# Patient Record
Sex: Female | Born: 1960 | Race: White | Hispanic: No | Smoking: Never smoker
Health system: Southern US, Community
[De-identification: ages and names within clinical notes are randomized; demographics above are authoritative.]

## PROBLEM LIST (undated history)

## (undated) DIAGNOSIS — N760 Acute vaginitis: Secondary | ICD-10-CM

## (undated) DIAGNOSIS — R519 Headache, unspecified: Secondary | ICD-10-CM

## (undated) DIAGNOSIS — J329 Chronic sinusitis, unspecified: Secondary | ICD-10-CM

## (undated) DIAGNOSIS — R51 Headache: Secondary | ICD-10-CM

## (undated) DIAGNOSIS — E785 Hyperlipidemia, unspecified: Secondary | ICD-10-CM

## (undated) DIAGNOSIS — J309 Allergic rhinitis, unspecified: Secondary | ICD-10-CM

## (undated) HISTORY — DX: Hyperlipidemia, unspecified: E78.5

## (undated) HISTORY — DX: Allergic rhinitis, unspecified: J30.9

## (undated) HISTORY — PX: COLONOSCOPY: SHX174

## (undated) HISTORY — PX: TOTAL ABDOMINAL HYSTERECTOMY W/ BILATERAL SALPINGOOPHORECTOMY: SHX83

## (undated) HISTORY — PX: NASAL SINUS SURGERY: SHX719

## (undated) HISTORY — DX: Acute vaginitis: N76.0

## (undated) HISTORY — DX: Chronic sinusitis, unspecified: J32.9

## (undated) HISTORY — PX: ABDOMINAL HYSTERECTOMY: SHX81

---

## 2005-09-08 ENCOUNTER — Other Ambulatory Visit: Payer: Self-pay

## 2005-09-16 ENCOUNTER — Ambulatory Visit: Payer: Self-pay | Admitting: Otolaryngology

## 2011-10-20 ENCOUNTER — Ambulatory Visit: Payer: Self-pay | Admitting: Family Medicine

## 2011-12-26 ENCOUNTER — Ambulatory Visit: Payer: Self-pay | Admitting: Unknown Physician Specialty

## 2012-11-04 ENCOUNTER — Ambulatory Visit: Payer: Self-pay | Admitting: Family Medicine

## 2012-11-28 ENCOUNTER — Ambulatory Visit: Payer: Self-pay | Admitting: Physician Assistant

## 2013-11-08 ENCOUNTER — Ambulatory Visit: Payer: Self-pay | Admitting: Family Medicine

## 2014-11-16 ENCOUNTER — Ambulatory Visit: Payer: Self-pay | Admitting: Obstetrics and Gynecology

## 2015-03-22 ENCOUNTER — Ambulatory Visit
Admit: 2015-03-22 | Disposition: A | Discharge status: Home / Self Care | Payer: Self-pay | Attending: Family Medicine | Admitting: Family Medicine

## 2015-03-30 DIAGNOSIS — N939 Abnormal uterine and vaginal bleeding, unspecified: Secondary | ICD-10-CM

## 2015-04-10 ENCOUNTER — Encounter
Admission: RE | Admit: 2015-04-10 | Discharge: 2015-04-10 | Disposition: A | Discharge status: Home / Self Care | Payer: BLUE CROSS/BLUE SHIELD | Source: Ambulatory Visit | Attending: Obstetrics and Gynecology | Admitting: Obstetrics and Gynecology

## 2015-04-10 DIAGNOSIS — E785 Hyperlipidemia, unspecified: Secondary | ICD-10-CM | POA: Diagnosis not present

## 2015-04-10 DIAGNOSIS — Z01810 Encounter for preprocedural cardiovascular examination: Secondary | ICD-10-CM | POA: Insufficient documentation

## 2015-04-10 DIAGNOSIS — I10 Essential (primary) hypertension: Secondary | ICD-10-CM | POA: Diagnosis not present

## 2015-04-10 DIAGNOSIS — J329 Chronic sinusitis, unspecified: Secondary | ICD-10-CM | POA: Insufficient documentation

## 2015-04-10 DIAGNOSIS — N760 Acute vaginitis: Secondary | ICD-10-CM | POA: Insufficient documentation

## 2015-04-10 DIAGNOSIS — Z01812 Encounter for preprocedural laboratory examination: Secondary | ICD-10-CM | POA: Diagnosis present

## 2015-04-10 LAB — CBC
HCT: 37.2 % (ref 35.0–47.0)
HEMOGLOBIN: 12.6 g/dL (ref 12.0–16.0)
MCH: 32.3 pg (ref 26.0–34.0)
MCHC: 34 g/dL (ref 32.0–36.0)
MCV: 95 fL (ref 80.0–100.0)
Platelets: 278 10*3/uL (ref 150–440)
RBC: 3.91 MIL/uL (ref 3.80–5.20)
RDW: 13.2 % (ref 11.5–14.5)
WBC: 6.5 10*3/uL (ref 3.6–11.0)

## 2015-04-10 LAB — BASIC METABOLIC PANEL
Anion gap: 7 (ref 5–15)
BUN: 13 mg/dL (ref 6–20)
CO2: 28 mmol/L (ref 22–32)
Calcium: 8.9 mg/dL (ref 8.9–10.3)
Chloride: 103 mmol/L (ref 101–111)
Creatinine, Ser: 0.67 mg/dL (ref 0.44–1.00)
GFR calc Af Amer: >60 mL/min (ref >60)
GFR calc non Af Amer: >60 mL/min (ref >60)
GLUCOSE: 85 mg/dL (ref 65–99)
POTASSIUM: 3.9 mmol/L (ref 3.5–5.1)
Sodium: 138 mmol/L (ref 135–145)

## 2015-04-10 NOTE — Patient Instructions (Signed)
  Your procedure is scheduled on: Friday May 20,2016 Report to Day Surgery. To find out your arrival time please call [phone] between 1PM - 3PM on Thursday May 19,2016.  Remember: Instructions that are not followed completely may result in serious medical risk, up to and including death, or upon the discretion of your surgeon and anesthesiologist your surgery may need to be rescheduled.    __x__ 1. Do not eat food or drink liquids after midnight. No gum chewing or hard candies.     __x__ 2. No Alcohol for 24 hours before or after surgery.   __x__ 3. Bring all medications with you on the day of surgery if instructed.    __x__ 4. Notify your doctor if there is any change in your medical condition     (cold, fever, infections).     Do not wear jewelry, make-up, hairpins, clips or nail polish.  Do not wear lotions, powders, or perfumes. You may wear deodorant.  Do not shave 48 hours prior to surgery. Men may shave face and neck.  Do not bring valuables to the hospital.    [facility] is not responsible for any belongings or valuables.               Contacts, dentures or bridgework may not be worn into surgery.  Leave your suitcase in the car. After surgery it may be brought to your room.  For patients admitted to the hospital, discharge time is determined by your                treatment team.   Patients discharged the day of surgery will not be allowed to drive home.   Please read over the following fact sheets that you were given:   Surgical Site Infection Prevention   ____ Take these medicines the morning of surgery with A SIP OF WATER:    1.   2.   3.   4.  5.  6.  ____ Fleet Enema (as directed)   ____ Use CHG Soap as directed  ____ Use inhalers on the day of surgery  ____ Stop metformin 2 days prior to surgery    ____ Take 1/2 of usual insulin dose the night before surgery and none on the morning of surgery.   ____ Stop Coumadin/Plavix/aspirin on   ____ Stop  Anti-inflammatories on    ____ Stop supplements until after surgery.    ____ Bring C-Pap to the hospital.

## 2015-04-11 LAB — TYPE AND SCREEN
ABO/RH(D): O POS
Antibody Screen: NEGATIVE

## 2015-04-11 LAB — ABO/RH: ABO/RH(D): O POS

## 2015-04-20 ENCOUNTER — Ambulatory Visit
Admission: RE | Admit: 2015-04-20 | Discharge: 2015-04-20 | Disposition: A | Discharge status: Home / Self Care | Payer: BLUE CROSS/BLUE SHIELD | Source: Ambulatory Visit | Attending: Obstetrics and Gynecology | Admitting: Obstetrics and Gynecology

## 2015-04-20 ENCOUNTER — Encounter
Admission: RE | Disposition: A | Discharge status: Home / Self Care | Payer: Self-pay | Source: Ambulatory Visit | Attending: Obstetrics and Gynecology

## 2015-04-20 ENCOUNTER — Encounter: Payer: Self-pay | Admitting: *Deleted

## 2015-04-20 ENCOUNTER — Ambulatory Visit: Payer: BLUE CROSS/BLUE SHIELD | Admitting: Anesthesiology

## 2015-04-20 DIAGNOSIS — Z79899 Other long term (current) drug therapy: Secondary | ICD-10-CM | POA: Diagnosis not present

## 2015-04-20 DIAGNOSIS — R938 Abnormal findings on diagnostic imaging of other specified body structures: Secondary | ICD-10-CM | POA: Insufficient documentation

## 2015-04-20 DIAGNOSIS — N938 Other specified abnormal uterine and vaginal bleeding: Secondary | ICD-10-CM | POA: Insufficient documentation

## 2015-04-20 DIAGNOSIS — G43909 Migraine, unspecified, not intractable, without status migrainosus: Secondary | ICD-10-CM | POA: Insufficient documentation

## 2015-04-20 DIAGNOSIS — I10 Essential (primary) hypertension: Secondary | ICD-10-CM | POA: Insufficient documentation

## 2015-04-20 DIAGNOSIS — Z7982 Long term (current) use of aspirin: Secondary | ICD-10-CM | POA: Diagnosis not present

## 2015-04-20 DIAGNOSIS — N939 Abnormal uterine and vaginal bleeding, unspecified: Secondary | ICD-10-CM

## 2015-04-20 HISTORY — PX: HYSTEROSCOPY W/D&C: SHX1775

## 2015-04-20 LAB — POCT PREGNANCY, URINE: PREG TEST UR: NEGATIVE

## 2015-04-20 SURGERY — DILATATION AND CURETTAGE /HYSTEROSCOPY
Anesthesia: Choice

## 2015-04-20 MED ORDER — DOCUSATE SODIUM 100 MG PO CAPS: 100.0000 mg | ORAL_CAPSULE | Freq: Two times a day (BID) | ORAL | Status: DC | PRN

## 2015-04-20 MED ORDER — FAMOTIDINE 20 MG PO TABS
ORAL_TABLET | ORAL | Status: AC
  Administered 2015-04-20: 20 mg via ORAL
  Filled 2015-04-20: qty 1

## 2015-04-20 MED ORDER — ACETAMINOPHEN 10 MG/ML IV SOLN
INTRAVENOUS | Status: DC | PRN
  Administered 2015-04-20: 1000 mg via INTRAVENOUS

## 2015-04-20 MED ORDER — FENTANYL CITRATE (PF) 100 MCG/2ML IJ SOLN
25.0000 ug | INTRAMUSCULAR | Status: DC | PRN
  Administered 2015-04-20: 25 ug via INTRAVENOUS

## 2015-04-20 MED ORDER — LIDOCAINE HCL (PF) 1 % IJ SOLN
INTRAMUSCULAR | Status: AC
  Filled 2015-04-20: qty 2

## 2015-04-20 MED ORDER — FAMOTIDINE 20 MG PO TABS
20.0000 mg | ORAL_TABLET | Freq: Once | ORAL | Status: AC
  Administered 2015-04-20: 20 mg via ORAL

## 2015-04-20 MED ORDER — FENTANYL CITRATE (PF) 100 MCG/2ML IJ SOLN
INTRAMUSCULAR | Status: AC
  Filled 2015-04-20: qty 2

## 2015-04-20 MED ORDER — LACTATED RINGERS IV SOLN
INTRAVENOUS | Status: DC
  Administered 2015-04-20 (×2): via INTRAVENOUS

## 2015-04-20 MED ORDER — ONDANSETRON HCL 4 MG/2ML IJ SOLN: 4.0000 mg | Freq: Once | INTRAMUSCULAR | Status: DC | PRN

## 2015-04-20 MED ORDER — PROPOFOL INFUSION 10 MG/ML OPTIME
INTRAVENOUS | Status: DC | PRN
  Administered 2015-04-20: 180 ug/kg/min via INTRAVENOUS

## 2015-04-20 MED ORDER — LIDOCAINE HCL (CARDIAC) 20 MG/ML IV SOLN
INTRAVENOUS | Status: DC | PRN
  Administered 2015-04-20: 80 mg via INTRAVENOUS

## 2015-04-20 MED ORDER — SILVER NITRATE-POT NITRATE 75-25 % EX MISC
CUTANEOUS | Status: DC | PRN
  Administered 2015-04-20: 1

## 2015-04-20 MED ORDER — MIDAZOLAM HCL 2 MG/2ML IJ SOLN
INTRAMUSCULAR | Status: DC | PRN
  Administered 2015-04-20: 2 mg via INTRAVENOUS

## 2015-04-20 MED ORDER — SILVER NITRATE-POT NITRATE 75-25 % EX MISC
CUTANEOUS | Status: AC
  Filled 2015-04-20: qty 1

## 2015-04-20 MED ORDER — ACETAMINOPHEN 10 MG/ML IV SOLN
INTRAVENOUS | Status: AC
  Filled 2015-04-20: qty 100

## 2015-04-20 MED ORDER — FENTANYL CITRATE (PF) 100 MCG/2ML IJ SOLN
INTRAMUSCULAR | Status: DC | PRN
  Administered 2015-04-20: 50 ug via INTRAVENOUS

## 2015-04-20 MED ORDER — KETOROLAC TROMETHAMINE 30 MG/ML IJ SOLN
INTRAMUSCULAR | Status: DC | PRN
  Administered 2015-04-20: 30 mg via INTRAVENOUS

## 2015-04-20 MED ORDER — IBUPROFEN 600 MG PO TABS: 600.0000 mg | ORAL_TABLET | Freq: Four times a day (QID) | ORAL | Status: DC | PRN

## 2015-04-20 MED ORDER — BUPIVACAINE HCL (PF) 0.5 % IJ SOLN
INTRAMUSCULAR | Status: AC
  Filled 2015-04-20: qty 30

## 2015-04-20 SURGICAL SUPPLY — 15 items
CANISTER SUCT 3000ML (MISCELLANEOUS) ×3 IMPLANT
CATH ROBINSON RED A/P 16FR (CATHETERS) ×3 IMPLANT
GLOVE BIO SURGEON STRL SZ 6.5 (GLOVE) ×2 IMPLANT
GLOVE BIO SURGEONS STRL SZ 6.5 (GLOVE) ×1
GLOVE INDICATOR 7.0 STRL GRN (GLOVE) ×3 IMPLANT
GOWN STRL REUS W/ TWL LRG LVL3 (GOWN DISPOSABLE) ×2 IMPLANT
GOWN STRL REUS W/TWL LRG LVL3 (GOWN DISPOSABLE) ×4
IV LACTATED RINGERS 1000ML (IV SOLUTION) ×3 IMPLANT
KIT RM TURNOVER CYSTO AR (KITS) ×3 IMPLANT
PACK DNC HYST (MISCELLANEOUS) ×3 IMPLANT
PAD GROUND ADULT SPLIT (MISCELLANEOUS) ×3 IMPLANT
PAD OB MATERNITY 4.3X12.25 (PERSONAL CARE ITEMS) ×3 IMPLANT
PAD PREP 24X41 OB/GYN DISP (PERSONAL CARE ITEMS) ×3 IMPLANT
TUBING CONNECTING 10 (TUBING) ×2 IMPLANT
TUBING CONNECTING 10' (TUBING) ×1

## 2015-04-20 NOTE — Op Note (Signed)
Operative Report Hysteroscopy with Dilation and Curettage   Indications: Thickened endometrial stripe, abnormal uterine bleeding   Pre-operative Diagnosis: AUB  Post-operative Diagnosis: same.  Procedure: 1. D&C 2. Hysteroscopy  Surgeon: Christeen DouglasBethany Jerred Zaremba, MD  Assistant(s):  None  Anesthesia: General mask inhalational anesthesia  Estimated Blood Loss:  Minimal         Intraoperative medications: None         Total IV Fluids: 400ml  Urine Output: 25ml         Specimens: Endometrial curettings, endocervical curettings         Complications:  None; patient tolerated the procedure well.         Disposition: PACU - hemodynamically stable.         Condition: stable  Findings: Small, retroverted uterus measuring 8cm by sound; normal cervix, vagina, perineum, stenotic os. Thin atropic uterine lining consistent with findings on OCPs, and a gritty texture was noted on curettage from the outset of the procedure. Initially, there appeared to be a uterine adhesion or uterine septum, but after currettage this band of tissue was not apparent. Pictures were taken.  Indication for procedure/Consents: 54 y.o. G0 here for scheduled surgery for the aforementioned diagnoses.  Endometrial biopsy unsuccessful in the office. Risks of surgery were discussed with the patient including but not limited to: bleeding which may require transfusion; infection which may require antibiotics; injury to uterus or surrounding organs; intrauterine scarring which may impair future fertility; need for additional procedures including laparotomy or laparoscopy; and other postoperative/anesthesia complications. Written informed consent was obtained.    Procedure Details:   The patient was taken to the operating room where general iv anesthesia not requiring LMA was administered and was found to be adequate.  After a formal and adequate timeout was performed, she was placed in the dorsal lithotomy position and examined  with the above findings. She was then prepped and draped in the sterile manner.   Her bladder was catheterized for an estimated amount of clear, yellow urine. A weighed speculum was then placed in the patient's vagina and a single tooth tenaculum was applied to the anterior lip of the cervix. A second bite was taken on the posterior lip of the cervix to assist in visualization when the anterior bite was unsuccessful.  Her cervix required lacrimal dilators to accommodate full sized Hanks dilators. It was serially dilated to 16 [place].  An ECC was performed. The hysteroscope was introduced to reveal above findings. A sharp curettage was then performed until there was a gritty texture in all four quadrants.  The tenaculum was removed from the anterior lip of the cervix and the vaginal speculum was removed after applying silver nitrate for good hemostasis. The patient tolerated the procedure well and was taken to the recovery area awake and in stable condition. She received iv acetaminophen and Toradol prior to leaving the OR.  The patient will be discharged to home as per PACU criteria. Routine postoperative instructions given.  She was prescribed Ibuprofen and Colace.  She will follow up in the clinic in two weeks for postoperative evaluation.

## 2015-04-20 NOTE — Anesthesia Postprocedure Evaluation (Signed)
  Anesthesia Post-op Note  Patient: Kimberly Yang  Procedure(s) Performed: Procedure(s): DILATATION AND CURETTAGE /HYSTEROSCOPY (N/A)  Anesthesia type:No value filed.  Patient location: PACU  Post pain: Pain level controlled  Post assessment: Post-op Vital signs reviewed, Patient's Cardiovascular Status Stable, Respiratory Function Stable, Patent Airway and No signs of Nausea or vomiting  Post vital signs: Reviewed and stable  Last Vitals:  Filed Vitals:   04/20/15 1243  BP: 121/69  Pulse: 58  Temp:   Resp: 16    Level of consciousness: awake, alert  and patient cooperative  Complications: No apparent anesthesia complications

## 2015-04-20 NOTE — Progress Notes (Signed)
Preop report given to Vergia AlbertsSarah Olejar by Nicholes RoughSusan Lavra Imler

## 2015-04-20 NOTE — Transfer of Care (Signed)
Immediate Anesthesia Transfer of Care Note  Patient: Kimberly Yang  Procedure(s) Performed: Procedure(s): DILATATION AND CURETTAGE /HYSTEROSCOPY (N/A)  Patient Location: PACU  Anesthesia Type:General  Level of Consciousness: awake, alert  and oriented  Airway & Oxygen Therapy: Patient Spontanous Breathing and Patient connected to face mask oxygen  Post-op Assessment: Report given to RN, Post -op Vital signs reviewed and stable and Patient moving all extremities X 4  Post vital signs: Reviewed and stable  Last Vitals:  Filed Vitals:   04/20/15 0919  BP: 124/75  Pulse: 59  Temp: 36.9 C  Resp: 18    Complications: No apparent anesthesia complications

## 2015-04-20 NOTE — Anesthesia Preprocedure Evaluation (Signed)
Anesthesia Evaluation  Patient identified by MRN, date of birth, ID band Patient awake    Reviewed: Allergy & Precautions, H&P , NPO status , Patient's Chart, lab work & pertinent test results, reviewed documented beta blocker date and time   Airway Mallampati: II  TM Distance: >3 FB Neck ROM: full    Dental   Pulmonary Current Smoker,          Cardiovascular hypertension, Rate:Normal     Neuro/Psych    GI/Hepatic   Endo/Other    Renal/GU      Musculoskeletal   Abdominal   Peds  Hematology   Anesthesia Other Findings   Reproductive/Obstetrics                             Anesthesia Physical Anesthesia Plan  ASA: II  Anesthesia Plan:    Post-op Pain Management:    Induction:   Airway Management Planned:   Additional Equipment:   Intra-op Plan:   Post-operative Plan:   Informed Consent: I have reviewed the patients History and Physical, chart, labs and discussed the procedure including the risks, benefits and alternatives for the proposed anesthesia with the patient or authorized representative who has indicated his/her understanding and acceptance.     Plan Discussed with:   Anesthesia Plan Comments:         Anesthesia Quick Evaluation

## 2015-04-20 NOTE — H&P (Signed)
  Date of Initial H&P: 04/10/15  History reviewed, patient examined, no change in status, stable for surgery.  Paper chart H&P completed and will be scanned into EMR.  

## 2015-04-20 NOTE — Discharge Instructions (Signed)
Discharge instructions after a hysteroscopy with dilation and curettage ? ?Signs and Symptoms to Report ? ?Call our office at [phone] if you have any of the following:  ? ? Fever over 100.4 degrees or higher ? Severe stomach pain not relieved with pain medications ? Bright red bleeding that?s heavier than a period that does not slow with rest after the first 24 hours ? To go the bathroom a lot (frequency), you can?t hold your urine (urgency), or it hurts when you empty your bladder (urinate) ? Chest pain ? Shortness of breath ? Pain in the calves of your legs ? Severe nausea and vomiting not relieved with anti-nausea medications ? Any concerns ? ?What You Can Expect after Surgery ? You may see some pink tinged, bloody fluid. This is normal. You may also have cramping for several days.  ? ?Activities after Your Discharge ?Follow these guidelines to help speed your recovery at home: ? Don?t drive if you are in pain or taking narcotic pain medicine. You may drive when you can safely slam on the brakes, turn the wheel forcefully, and rotate your torso comfortably. This is typically 4-7 days. Practice in a parking lot or side street prior to attempting to drive regularly.  ? Ask others to help with household chores for 4 weeks. ? Don?t do strenuous activities, exercises, or sports like vacuuming, tennis, squash, etc. until your doctor says it is safe to do so. ? Walk as you feel able. Rest often since it may take a week or two for your energy level to return to normal.  ? You may climb stairs ? Avoid constipation: ?  -Eat fruits, vegetables, and whole grains. Eat small meals as your appetite will take time to return to normal. ?  -Drink 6 to 8 glasses of water each day unless your doctor has told you to limit your fluids. ?  -Use a laxative or stool softener as needed if constipation becomes a problem. You may take Miralax, metamucil, Citrucil, Colace, Senekot, FiberCon, etc. If this does not relieve the  constipation, try two tablespoons of Milk Of Magnesia every 8 hours until your bowels move.  ? You may shower.  ? Do not get in a hot tub, swimming pool, etc. until your doctor agrees. ? Do not douche, use tampons, or have sex until your doctor says it is okay, usually about 2 weeks. ? Take your pain medicine when you need it. The medicine may not work as well if the pain is bad. ? ?Take the medicines you were taking before surgery. Other medications you might need are pain medications (ibuprofen), medications for constipation (Colace) and nausea medications (Zofran).  ? ?  ?

## 2015-04-23 ENCOUNTER — Encounter: Payer: Self-pay | Admitting: Obstetrics and Gynecology

## 2015-04-23 LAB — SURGICAL PATHOLOGY

## 2015-05-15 ENCOUNTER — Other Ambulatory Visit: Payer: BLUE CROSS/BLUE SHIELD

## 2015-05-15 ENCOUNTER — Encounter: Payer: Self-pay | Admitting: *Deleted

## 2015-05-15 ENCOUNTER — Other Ambulatory Visit: Payer: Self-pay | Admitting: Obstetrics and Gynecology

## 2015-05-15 ENCOUNTER — Ambulatory Visit: Payer: Self-pay | Admitting: Family Medicine

## 2015-05-15 DIAGNOSIS — Z79899 Other long term (current) drug therapy: Secondary | ICD-10-CM | POA: Diagnosis not present

## 2015-05-15 DIAGNOSIS — Z793 Long term (current) use of hormonal contraceptives: Secondary | ICD-10-CM | POA: Diagnosis present

## 2015-05-15 DIAGNOSIS — N800 Endometriosis of uterus: Secondary | ICD-10-CM | POA: Diagnosis not present

## 2015-05-15 DIAGNOSIS — Z9889 Other specified postprocedural states: Secondary | ICD-10-CM | POA: Diagnosis not present

## 2015-05-15 DIAGNOSIS — Z8249 Family history of ischemic heart disease and other diseases of the circulatory system: Secondary | ICD-10-CM | POA: Diagnosis not present

## 2015-05-15 DIAGNOSIS — N838 Other noninflammatory disorders of ovary, fallopian tube and broad ligament: Secondary | ICD-10-CM | POA: Diagnosis not present

## 2015-05-15 DIAGNOSIS — I10 Essential (primary) hypertension: Secondary | ICD-10-CM | POA: Diagnosis not present

## 2015-05-15 DIAGNOSIS — Z823 Family history of stroke: Secondary | ICD-10-CM | POA: Diagnosis not present

## 2015-05-15 DIAGNOSIS — E785 Hyperlipidemia, unspecified: Secondary | ICD-10-CM | POA: Diagnosis not present

## 2015-05-15 DIAGNOSIS — Z7982 Long term (current) use of aspirin: Secondary | ICD-10-CM | POA: Diagnosis present

## 2015-05-15 DIAGNOSIS — N939 Abnormal uterine and vaginal bleeding, unspecified: Secondary | ICD-10-CM | POA: Diagnosis present

## 2015-05-15 NOTE — H&P (Signed)
Chief Complaint: persistent AUB.  History of Present Illness: Kimberly Yang is a 54 y.o. G0P0000 who desires definitive management of her persistent AUB. She has failed medical management.   She underwent an uncomplicated D&C, hysteroscopy and ECC 04/20/15 for AUB - she continues to have her period, despite this surgery and OCPs. She also continues ot have hot flashes, night sweats and insomnia.  TVUS 03/30/15 Thickened endometrium=7.98 mm Lt ov wnl Rt ov with two simple cysts= 1 =0.80 cm 2= 0.82 cm  Fractional D&C pathology was benign  She has been maintained on OCPs for this indication, without successful resolution of her bleeding.  Past Medical History:  has a past medical history of History of chickenpox; Hyperlipidemia; Hypertension; and Migraines. Problem List: has Hyperlipidemia; Hypertension; Migraines; and Abnormal uterine bleeding (AUB) on her problem list. Past Surgical History:  has past surgical history that includes Nasal surgery. Family History: family history includes Heart attack in her mother; Hypertension in her mother; Stroke in her brother. Social History:  reports that she has never smoked. She has never used smokeless tobacco. She reports that she does not drink alcohol or use illicit drugs.  Prior to encounter Medications:  Current Outpatient Prescriptions on File Prior to Visit  Medication Sig Dispense Refill  . aspirin 81 MG EC tablet Take 81 mg by mouth once daily.    Marland Kitchen atorvastatin (LIPITOR) 20 MG tablet Take 20 mg by mouth once daily.    . bisoprolol-hydrochlorothiazide (ZIAC) 5-6.25 mg tablet Take 1 tablet by mouth once daily.    . cholecalciferol (CHOLECALCIFEROL) 1,000 unit tablet Take 1,000 Units by mouth once daily.    Marland Kitchen EVENING PRIMROSE OIL ORAL Take by mouth.    . metroNIDAZOLE (METROGEL) 0.75 % vaginal gel Place 1 applicator vaginally nightly. 70 g 0  . norethindrone-ethinyl estradiol (MICROGESTIN,LOESTRIN,JUNEL  1/20) 1-20 mg-mcg tablet Take 1 tablet by mouth once daily. 3 Package 3   No current facility-administered medications on file prior to visit.     Medications:  Current Outpatient Prescriptions on File Prior to Visit  Medication Sig Dispense Refill  . aspirin 81 MG EC tablet Take 81 mg by mouth once daily.    Marland Kitchen atorvastatin (LIPITOR) 20 MG tablet Take 20 mg by mouth once daily.    . bisoprolol-hydrochlorothiazide (ZIAC) 5-6.25 mg tablet Take 1 tablet by mouth once daily.    . cholecalciferol (CHOLECALCIFEROL) 1,000 unit tablet Take 1,000 Units by mouth once daily.    Marland Kitchen EVENING PRIMROSE OIL ORAL Take by mouth.    . metroNIDAZOLE (METROGEL) 0.75 % vaginal gel Place 1 applicator vaginally nightly. 70 g 0  . norethindrone-ethinyl estradiol (MICROGESTIN,LOESTRIN,JUNEL 1/20) 1-20 mg-mcg tablet Take 1 tablet by mouth once daily. 3 Package 3   No current facility-administered medications on file prior to visit.   Allergies: Review of patient's allergies indicates no known allergies.  Physical Exam: BP 122/87 mmHg  Pulse 68  Wt 77.384 kg (170 lb 9.6 oz)  LMP 04/30/2015  General appearance: Well nourished, well developed female in no acute distress.   Abdomen: Nontender to palpation, nondistended, no masses or hernias.  Incisions: none.  Neuro/Psych: Normal mood and affect.   Pelvic exam: normal   Assessment:   Full preop visit performed today for a total of 20 min for a LAVH, BSO for AUB failed medical management. Risks, benefits and alternative discussed. Consents signed today.  Patient returns for a preoperative discussion regarding her plans to proceed with surgical treatment of her AUB by  above procedures. The patient and I discussed the technical aspects of the procedure including the potential for risks and complications. These include but are not limited to the risk of infection requiring post-operative antibiotics or  further procedures. We talked about the risk of injury to adjacent organs including bladder, bowel, ureter, blood vessels or nerves. We talked about the need to convert to an open incision. We talked about the possible need for blood transfusion. We talked aboutpostop complications such asthromboembolic or cardiopulmonary complications. All of her questions were answered. Her preoperative exam was completed and the appropriate consents were signed. She is scheduled to undergo this procedure 05/21/15.        Her FSH is pre menopausal. Check a TSH at next visit. Plan for HRT for vasomotor sx.

## 2015-05-15 NOTE — Patient Instructions (Signed)
  Your procedure is scheduled on: 05/21/15 Report to Day Surgery. To find out your arrival time please call [phone] between 1PM - 3PM on 05/18/15.  Remember: Instructions that are not followed completely may result in serious medical risk, up to and including death, or upon the discretion of your surgeon and anesthesiologist your surgery may need to be rescheduled.    ___x_ 1. Do not eat food or drink liquids after midnight. No gum chewing or hard candies.     _x___ 2. No Alcohol for 24 hours before or after surgery.   ____ 3. Bring all medications with you on the day of surgery if instructed.    __x__ 4. Notify your doctor if there is any change in your medical condition     (cold, fever, infections).     Do not wear jewelry, make-up, hairpins, clips or nail polish.  Do not wear lotions, powders, or perfumes. You may wear deodorant.  Do not shave 48 hours prior to surgery. Men may shave face and neck.  Do not bring valuables to the hospital.    [facility] is not responsible for any belongings or valuables.               Contacts, dentures or bridgework may not be worn into surgery.  Leave your suitcase in the car. After surgery it may be brought to your room.  For patients admitted to the hospital, discharge time is determined by your                treatment team.   Patients discharged the day of surgery will not be allowed to drive home.   Please read over the following fact sheets that you were given:   Surgical Site Infection Prevention   ____ Take these medicines the morning of surgery with A SIP OF WATER:    1. ziac  2.   3.   4.  5.  6.  ____ Fleet Enema (as directed)   x____ Use CHG Soap as directed  ____ Use inhalers on the day of surgery  ____ Stop metformin 2 days prior to surgery    ____ Take 1/2 of usual insulin dose the night before surgery and none on the morning of surgery.   ____ Stop Coumadin/Plavix/aspirin on  ____ Stop Anti-inflammatories  on   ____ Stop supplements until after surgery.    ____ Bring C-Pap to the hospital.

## 2015-05-15 NOTE — H&P (Signed)
Chief Complaint: persistent AUB.  History of Present Illness: Kimberly Yang is a 54 y.o. G0P0000 who desires definitive management of her persistent AUB. She has failed medical management.   She underwent an uncomplicated D&C, hysteroscopy and ECC 04/20/15 for AUB - she continues to have her period, despite this surgery and OCPs. She also continues ot have hot flashes, night sweats and insomnia.  TVUS 03/30/15 Thickened endometrium=7.98 mm Lt ov wnl Rt ov with two simple cysts= 1 =0.80 cm 2= 0.82 cm  Fractional D&C pathology was benign  She has been maintained on OCPs for this indication, without successful resolution of her bleeding.  Past Medical History:  has a past medical history of History of chickenpox; Hyperlipidemia; Hypertension; and Migraines. Problem List: has Hyperlipidemia; Hypertension; Migraines; and Abnormal uterine bleeding (AUB) on her problem list. Past Surgical History:  has past surgical history that includes Nasal surgery. Family History: family history includes Heart attack in her mother; Hypertension in her mother; Stroke in her brother. Social History:  reports that she has never smoked. She has never used smokeless tobacco. She reports that she does not drink alcohol or use illicit drugs.  Prior to encounter Medications:  Current Outpatient Prescriptions on File Prior to Visit  Medication Sig Dispense Refill  . aspirin 81 MG EC tablet Take 81 mg by mouth once daily.    . atorvastatin (LIPITOR) 20 MG tablet Take 20 mg by mouth once daily.    . bisoprolol-hydrochlorothiazide (ZIAC) 5-6.25 mg tablet Take 1 tablet by mouth once daily.    . cholecalciferol (CHOLECALCIFEROL) 1,000 unit tablet Take 1,000 Units by mouth once daily.    . EVENING PRIMROSE OIL ORAL Take by mouth.    . metroNIDAZOLE (METROGEL) 0.75 % vaginal gel Place 1 applicator vaginally nightly. 70 g 0  . norethindrone-ethinyl estradiol (MICROGESTIN,LOESTRIN,JUNEL  1/20) 1-20 mg-mcg tablet Take 1 tablet by mouth once daily. 3 Package 3   No current facility-administered medications on file prior to visit.     Medications:  Current Outpatient Prescriptions on File Prior to Visit  Medication Sig Dispense Refill  . aspirin 81 MG EC tablet Take 81 mg by mouth once daily.    . atorvastatin (LIPITOR) 20 MG tablet Take 20 mg by mouth once daily.    . bisoprolol-hydrochlorothiazide (ZIAC) 5-6.25 mg tablet Take 1 tablet by mouth once daily.    . cholecalciferol (CHOLECALCIFEROL) 1,000 unit tablet Take 1,000 Units by mouth once daily.    . EVENING PRIMROSE OIL ORAL Take by mouth.    . metroNIDAZOLE (METROGEL) 0.75 % vaginal gel Place 1 applicator vaginally nightly. 70 g 0  . norethindrone-ethinyl estradiol (MICROGESTIN,LOESTRIN,JUNEL 1/20) 1-20 mg-mcg tablet Take 1 tablet by mouth once daily. 3 Package 3   No current facility-administered medications on file prior to visit.   Allergies: Review of patient's allergies indicates no known allergies.  Physical Exam: BP 122/87 mmHg  Pulse 68  Wt 77.384 kg (170 lb 9.6 oz)  LMP 04/30/2015  General appearance: Well nourished, well developed female in no acute distress.   Abdomen: Nontender to palpation, nondistended, no masses or hernias.  Incisions: none.  Neuro/Psych: Normal mood and affect.   Pelvic exam: normal   Assessment:   Full preop visit performed today for a total of 20 min for a LAVH, BSO for AUB failed medical management. Risks, benefits and alternative discussed. Consents signed today.  Patient returns for a preoperative discussion regarding her plans to proceed with surgical treatment of her AUB by   above procedures. The patient and I discussed the technical aspects of the procedure including the potential for risks and complications. These include but are not limited to the risk of infection requiring post-operative antibiotics or  further procedures. We talked about the risk of injury to adjacent organs including bladder, bowel, ureter, blood vessels or nerves. We talked about the need to convert to an open incision. We talked about the possible need for blood transfusion. We talked aboutpostop complications such asthromboembolic or cardiopulmonary complications. All of her questions were answered. Her preoperative exam was completed and the appropriate consents were signed. She is scheduled to undergo this procedure 05/21/15.        Her FSH is pre menopausal. Check a TSH at next visit. Plan for HRT for vasomotor sx. 

## 2015-05-16 ENCOUNTER — Encounter
Admission: RE | Admit: 2015-05-16 | Discharge: 2015-05-16 | Disposition: A | Discharge status: Home / Self Care | Payer: BLUE CROSS/BLUE SHIELD | Source: Ambulatory Visit | Attending: Anesthesiology | Admitting: Anesthesiology

## 2015-05-16 DIAGNOSIS — N939 Abnormal uterine and vaginal bleeding, unspecified: Secondary | ICD-10-CM | POA: Diagnosis not present

## 2015-05-16 DIAGNOSIS — Z01812 Encounter for preprocedural laboratory examination: Secondary | ICD-10-CM | POA: Diagnosis present

## 2015-05-16 DIAGNOSIS — Z01810 Encounter for preprocedural cardiovascular examination: Secondary | ICD-10-CM | POA: Diagnosis present

## 2015-05-16 LAB — BASIC METABOLIC PANEL
ANION GAP: 9 (ref 5–15)
BUN: 12 mg/dL (ref 6–20)
CALCIUM: 8.7 mg/dL — AB (ref 8.9–10.3)
CO2: 25 mmol/L (ref 22–32)
Chloride: 102 mmol/L (ref 101–111)
Creatinine, Ser: 0.71 mg/dL (ref 0.44–1.00)
GLUCOSE: 86 mg/dL (ref 65–99)
Potassium: 3.8 mmol/L (ref 3.5–5.1)
Sodium: 136 mmol/L (ref 135–145)

## 2015-05-16 LAB — CBC
HCT: 39.4 % (ref 35.0–47.0)
HEMOGLOBIN: 13 g/dL (ref 12.0–16.0)
MCH: 31.6 pg (ref 26.0–34.0)
MCHC: 33 g/dL (ref 32.0–36.0)
MCV: 95.7 fL (ref 80.0–100.0)
Platelets: 269 10*3/uL (ref 150–440)
RBC: 4.11 MIL/uL (ref 3.80–5.20)
RDW: 13.4 % (ref 11.5–14.5)
WBC: 6.6 10*3/uL (ref 3.6–11.0)

## 2015-05-16 LAB — TYPE AND SCREEN
ABO/RH(D): O POS
Antibody Screen: NEGATIVE

## 2015-05-21 ENCOUNTER — Encounter
Admission: RE | Disposition: A | Discharge status: Home / Self Care | Payer: Self-pay | Source: Ambulatory Visit | Attending: Obstetrics and Gynecology

## 2015-05-21 ENCOUNTER — Ambulatory Visit: Payer: BLUE CROSS/BLUE SHIELD | Admitting: Anesthesiology

## 2015-05-21 ENCOUNTER — Observation Stay
Admission: RE | Admit: 2015-05-21 | Discharge: 2015-05-22 | Disposition: A | Discharge status: Home / Self Care | Payer: BLUE CROSS/BLUE SHIELD | Source: Ambulatory Visit | Attending: Obstetrics and Gynecology | Admitting: Obstetrics and Gynecology

## 2015-05-21 ENCOUNTER — Encounter: Payer: Self-pay | Admitting: *Deleted

## 2015-05-21 DIAGNOSIS — Z79899 Other long term (current) drug therapy: Secondary | ICD-10-CM | POA: Insufficient documentation

## 2015-05-21 DIAGNOSIS — N800 Endometriosis of uterus: Principal | ICD-10-CM | POA: Insufficient documentation

## 2015-05-21 DIAGNOSIS — Z7982 Long term (current) use of aspirin: Secondary | ICD-10-CM | POA: Insufficient documentation

## 2015-05-21 DIAGNOSIS — E785 Hyperlipidemia, unspecified: Secondary | ICD-10-CM | POA: Insufficient documentation

## 2015-05-21 DIAGNOSIS — Z4889 Encounter for other specified surgical aftercare: Secondary | ICD-10-CM

## 2015-05-21 DIAGNOSIS — Z793 Long term (current) use of hormonal contraceptives: Secondary | ICD-10-CM | POA: Insufficient documentation

## 2015-05-21 DIAGNOSIS — Z823 Family history of stroke: Secondary | ICD-10-CM | POA: Insufficient documentation

## 2015-05-21 DIAGNOSIS — I10 Essential (primary) hypertension: Secondary | ICD-10-CM | POA: Insufficient documentation

## 2015-05-21 DIAGNOSIS — N838 Other noninflammatory disorders of ovary, fallopian tube and broad ligament: Secondary | ICD-10-CM | POA: Insufficient documentation

## 2015-05-21 DIAGNOSIS — Z9889 Other specified postprocedural states: Secondary | ICD-10-CM | POA: Insufficient documentation

## 2015-05-21 DIAGNOSIS — Z8249 Family history of ischemic heart disease and other diseases of the circulatory system: Secondary | ICD-10-CM | POA: Insufficient documentation

## 2015-05-21 HISTORY — PX: LAPAROSCOPIC ASSISTED VAGINAL HYSTERECTOMY: SHX5398

## 2015-05-21 HISTORY — PX: LAPAROSCOPIC SALPINGO OOPHERECTOMY: SHX5927

## 2015-05-21 HISTORY — PX: CYSTOSCOPY: SHX5120

## 2015-05-21 HISTORY — DX: Headache: R51

## 2015-05-21 HISTORY — DX: Headache, unspecified: R51.9

## 2015-05-21 LAB — POCT PREGNANCY, URINE: PREG TEST UR: NEGATIVE

## 2015-05-21 SURGERY — HYSTERECTOMY, VAGINAL, LAPAROSCOPY-ASSISTED
Anesthesia: General

## 2015-05-21 MED ORDER — DEXAMETHASONE SODIUM PHOSPHATE 4 MG/ML IJ SOLN
INTRAMUSCULAR | Status: DC | PRN
  Administered 2015-05-21: 10 mg via INTRAVENOUS

## 2015-05-21 MED ORDER — BUPIVACAINE HCL (PF) 0.5 % IJ SOLN
INTRAMUSCULAR | Status: AC
  Filled 2015-05-21: qty 30

## 2015-05-21 MED ORDER — LORATADINE 10 MG PO TABS
10.0000 mg | ORAL_TABLET | Freq: Every day | ORAL | Status: DC
  Administered 2015-05-21: 10 mg via ORAL
  Filled 2015-05-21: qty 1

## 2015-05-21 MED ORDER — FLUORESCEIN SODIUM 10 % IJ SOLN
INTRAMUSCULAR | Status: AC
  Filled 2015-05-21: qty 5

## 2015-05-21 MED ORDER — HYDROMORPHONE HCL 1 MG/ML IJ SOLN
INTRAMUSCULAR | Status: AC
  Administered 2015-05-21: 0.25 mg via INTRAVENOUS
  Filled 2015-05-21: qty 1

## 2015-05-21 MED ORDER — LACTATED RINGERS IV SOLN: INTRAVENOUS | Status: DC

## 2015-05-21 MED ORDER — ATORVASTATIN CALCIUM 20 MG PO TABS
20.0000 mg | ORAL_TABLET | Freq: Every day | ORAL | Status: DC
  Administered 2015-05-21: 20 mg via ORAL
  Filled 2015-05-21 (×2): qty 1

## 2015-05-21 MED ORDER — ONDANSETRON HCL 4 MG PO TABS: 4.0000 mg | ORAL_TABLET | Freq: Four times a day (QID) | ORAL | Status: DC | PRN

## 2015-05-21 MED ORDER — FAMOTIDINE 20 MG PO TABS
20.0000 mg | ORAL_TABLET | Freq: Once | ORAL | Status: AC
  Administered 2015-05-21: 20 mg via ORAL
  Filled 2015-05-21: qty 1

## 2015-05-21 MED ORDER — SCOPOLAMINE 1 MG/3DAYS TD PT72: 1.0000 | MEDICATED_PATCH | TRANSDERMAL | Status: DC

## 2015-05-21 MED ORDER — BISOPROLOL-HYDROCHLOROTHIAZIDE 5-6.25 MG PO TABS: 1.0000 | ORAL_TABLET | Freq: Every day | ORAL | Status: DC

## 2015-05-21 MED ORDER — IBUPROFEN 600 MG PO TABS
600.0000 mg | ORAL_TABLET | Freq: Four times a day (QID) | ORAL | Status: DC | PRN
  Administered 2015-05-22: 600 mg via ORAL
  Filled 2015-05-21: qty 1

## 2015-05-21 MED ORDER — KETOROLAC TROMETHAMINE 30 MG/ML IJ SOLN
INTRAMUSCULAR | Status: DC | PRN
  Administered 2015-05-21: 30 mg via INTRAVENOUS

## 2015-05-21 MED ORDER — DOCUSATE SODIUM 100 MG PO CAPS
100.0000 mg | ORAL_CAPSULE | Freq: Two times a day (BID) | ORAL | Status: DC | PRN
  Filled 2015-05-21: qty 1

## 2015-05-21 MED ORDER — VITAMIN D3 25 MCG (1000 UNIT) PO TABS
1000.0000 [IU] | ORAL_TABLET | Freq: Every day | ORAL | Status: DC
  Administered 2015-05-21: 1000 [IU] via ORAL
  Filled 2015-05-21 (×2): qty 1

## 2015-05-21 MED ORDER — ONDANSETRON HCL 4 MG/2ML IJ SOLN: 4.0000 mg | Freq: Once | INTRAMUSCULAR | Status: DC | PRN

## 2015-05-21 MED ORDER — LIDOCAINE-EPINEPHRINE 1 %-1:100000 IJ SOLN
INTRAMUSCULAR | Status: DC | PRN
  Administered 2015-05-21: 10 mL

## 2015-05-21 MED ORDER — SUGAMMADEX SODIUM 200 MG/2ML IV SOLN
INTRAVENOUS | Status: DC | PRN
  Administered 2015-05-21: 145.2 mg via INTRAVENOUS

## 2015-05-21 MED ORDER — SUCCINYLCHOLINE CHLORIDE 20 MG/ML IJ SOLN
INTRAMUSCULAR | Status: DC | PRN
  Administered 2015-05-21: 120 mg via INTRAVENOUS

## 2015-05-21 MED ORDER — HYDROMORPHONE HCL 1 MG/ML IJ SOLN
0.2500 mg | INTRAMUSCULAR | Status: DC | PRN
  Administered 2015-05-21 (×2): 0.25 mg via INTRAVENOUS

## 2015-05-21 MED ORDER — ACETAMINOPHEN 10 MG/ML IV SOLN
INTRAVENOUS | Status: AC
  Filled 2015-05-21: qty 100

## 2015-05-21 MED ORDER — MENTHOL 3 MG MT LOZG: 1.0000 | LOZENGE | OROMUCOSAL | Status: DC | PRN

## 2015-05-21 MED ORDER — CEFAZOLIN SODIUM-DEXTROSE 2-3 GM-% IV SOLR
INTRAVENOUS | Status: AC
  Administered 2015-05-21: 2 g via INTRAVENOUS
  Filled 2015-05-21: qty 50

## 2015-05-21 MED ORDER — ROCURONIUM BROMIDE 100 MG/10ML IV SOLN
INTRAVENOUS | Status: DC | PRN
  Administered 2015-05-21: 40 mg via INTRAVENOUS
  Administered 2015-05-21: 10 mg via INTRAVENOUS

## 2015-05-21 MED ORDER — DOCUSATE SODIUM 100 MG PO CAPS
100.0000 mg | ORAL_CAPSULE | Freq: Two times a day (BID) | ORAL | Status: DC
  Administered 2015-05-21: 100 mg via ORAL

## 2015-05-21 MED ORDER — FENTANYL CITRATE (PF) 100 MCG/2ML IJ SOLN: 25.0000 ug | INTRAMUSCULAR | Status: DC | PRN

## 2015-05-21 MED ORDER — CEFAZOLIN SODIUM-DEXTROSE 2-3 GM-% IV SOLR
2.0000 g | INTRAVENOUS | Status: AC
  Administered 2015-05-21: 2 g via INTRAVENOUS

## 2015-05-21 MED ORDER — BUPIVACAINE HCL 0.5 % IJ SOLN
INTRAMUSCULAR | Status: DC | PRN
  Administered 2015-05-21: 20 mL

## 2015-05-21 MED ORDER — SCOPOLAMINE 1 MG/3DAYS TD PT72
MEDICATED_PATCH | TRANSDERMAL | Status: AC
  Filled 2015-05-21: qty 1

## 2015-05-21 MED ORDER — BISOPROLOL-HYDROCHLOROTHIAZIDE 5-6.25 MG PO TABS
1.0000 | ORAL_TABLET | Freq: Every day | ORAL | Status: DC
  Filled 2015-05-21 (×2): qty 1

## 2015-05-21 MED ORDER — LACTATED RINGERS IV SOLN
INTRAVENOUS | Status: DC
  Administered 2015-05-21 (×4): via INTRAVENOUS

## 2015-05-21 MED ORDER — ONDANSETRON HCL 4 MG/2ML IJ SOLN
INTRAMUSCULAR | Status: DC | PRN
  Administered 2015-05-21 (×2): 4 mg via INTRAVENOUS

## 2015-05-21 MED ORDER — LIDOCAINE HCL (CARDIAC) 20 MG/ML IV SOLN
INTRAVENOUS | Status: DC | PRN
  Administered 2015-05-21: 100 mg via INTRAVENOUS

## 2015-05-21 MED ORDER — ONDANSETRON HCL 4 MG/2ML IJ SOLN: 4.0000 mg | Freq: Four times a day (QID) | INTRAMUSCULAR | Status: DC | PRN

## 2015-05-21 MED ORDER — PROMETHAZINE HCL 25 MG RE SUPP
25.0000 mg | Freq: Four times a day (QID) | RECTAL | Status: DC | PRN
  Administered 2015-05-21: 25 mg via RECTAL
  Filled 2015-05-21 (×2): qty 1

## 2015-05-21 MED ORDER — ACETAMINOPHEN 10 MG/ML IV SOLN
INTRAVENOUS | Status: DC | PRN
  Administered 2015-05-21: 1000 mg via INTRAVENOUS

## 2015-05-21 MED ORDER — HYDROMORPHONE HCL 1 MG/ML IJ SOLN
0.2000 mg | INTRAMUSCULAR | Status: DC | PRN
  Administered 2015-05-21: 0.6 mg via INTRAVENOUS
  Filled 2015-05-21: qty 1

## 2015-05-21 MED ORDER — MIDAZOLAM HCL 2 MG/2ML IJ SOLN
INTRAMUSCULAR | Status: DC | PRN
  Administered 2015-05-21: 2 mg via INTRAVENOUS

## 2015-05-21 MED ORDER — LIDOCAINE-EPINEPHRINE 1 %-1:100000 IJ SOLN
INTRAMUSCULAR | Status: AC
  Filled 2015-05-21: qty 1

## 2015-05-21 MED ORDER — FENTANYL CITRATE (PF) 100 MCG/2ML IJ SOLN
INTRAMUSCULAR | Status: DC | PRN
  Administered 2015-05-21: 100 ug via INTRAVENOUS
  Administered 2015-05-21 (×4): 50 ug via INTRAVENOUS

## 2015-05-21 MED ORDER — LACTATED RINGERS IV SOLN
INTRAVENOUS | Status: DC
  Administered 2015-05-21 – 2015-05-22 (×2): via INTRAVENOUS

## 2015-05-21 MED ORDER — FAMOTIDINE 20 MG PO TABS
ORAL_TABLET | ORAL | Status: AC
  Filled 2015-05-21: qty 1

## 2015-05-21 MED ORDER — PROPOFOL 10 MG/ML IV BOLUS
INTRAVENOUS | Status: DC | PRN
  Administered 2015-05-21: 170 mg via INTRAVENOUS

## 2015-05-21 MED ORDER — HYDROMORPHONE HCL 2 MG PO TABS
2.0000 mg | ORAL_TABLET | ORAL | Status: DC | PRN
  Administered 2015-05-21 – 2015-05-22 (×5): 2 mg via ORAL
  Filled 2015-05-21 (×5): qty 1

## 2015-05-21 SURGICAL SUPPLY — 59 items
BAG URO DRAIN 2000ML W/SPOUT (MISCELLANEOUS) ×3 IMPLANT
BLADE SURG SZ11 CARB STEEL (BLADE) ×3 IMPLANT
CANISTER SUCT 1200ML W/VALVE (MISCELLANEOUS) ×3 IMPLANT
CATH FOLEY 2WAY  5CC 16FR (CATHETERS) ×1
CATH ROBINSON RED A/P 16FR (CATHETERS) ×3 IMPLANT
CATH URTH 16FR FL 2W BLN LF (CATHETERS) ×2 IMPLANT
CHLORAPREP W/TINT 26ML (MISCELLANEOUS) ×3 IMPLANT
DERMABOND ADVANCED (GAUZE/BANDAGES/DRESSINGS) ×1
DERMABOND ADVANCED .7 DNX12 (GAUZE/BANDAGES/DRESSINGS) ×2 IMPLANT
DRAPE SHEET LG 3/4 BI-LAMINATE (DRAPES) ×3 IMPLANT
DRSG TEGADERM 2-3/8X2-3/4 SM (GAUZE/BANDAGES/DRESSINGS) ×3 IMPLANT
ENDOPOUCH RETRIEVER 10 (MISCELLANEOUS) ×3 IMPLANT
FILTER LAP SMOKE EVAC STRL (MISCELLANEOUS) ×3 IMPLANT
GAUZE PACK 2X3YD (MISCELLANEOUS) ×3 IMPLANT
GAUZE SPONGE NON-WVN 2X2 STRL (MISCELLANEOUS) ×2 IMPLANT
GLOVE BIO SURGEON STRL SZ 6.5 (GLOVE) ×3 IMPLANT
GLOVE INDICATOR 7.0 STRL GRN (GLOVE) ×3 IMPLANT
GOWN STRL REUS W/ TWL LRG LVL3 (GOWN DISPOSABLE) ×4 IMPLANT
GOWN STRL REUS W/ TWL XL LVL3 (GOWN DISPOSABLE) ×2 IMPLANT
GOWN STRL REUS W/TWL LRG LVL3 (GOWN DISPOSABLE) ×2
GOWN STRL REUS W/TWL XL LVL3 (GOWN DISPOSABLE) ×1
IRRIGATION STRYKERFLOW (MISCELLANEOUS) ×2 IMPLANT
IRRIGATOR STRYKERFLOW (MISCELLANEOUS) ×3
IV LACTATED RINGERS 1000ML (IV SOLUTION) ×3 IMPLANT
KIT RM TURNOVER CYSTO AR (KITS) ×3 IMPLANT
LABEL OR SOLS (LABEL) ×3 IMPLANT
LIGASURE BLUNT 5MM 37CM (INSTRUMENTS) ×3 IMPLANT
MONOPOLAR CURVED METZENBAUM SCISSOR ×3 IMPLANT
NDL SAFETY 22GX1.5 (NEEDLE) ×3 IMPLANT
NS IRRIG 500ML POUR BTL (IV SOLUTION) ×3 IMPLANT
PACK BASIN MINOR ARMC (MISCELLANEOUS) ×3 IMPLANT
PACK GYN LAPAROSCOPIC (MISCELLANEOUS) ×3 IMPLANT
PAD GROUND ADULT SPLIT (MISCELLANEOUS) ×3 IMPLANT
PAD OB MATERNITY 4.3X12.25 (PERSONAL CARE ITEMS) ×3 IMPLANT
PAD PREP 24X41 OB/GYN DISP (PERSONAL CARE ITEMS) ×3 IMPLANT
SET CYSTO W/LG BORE CLAMP LF (SET/KITS/TRAYS/PACK) ×3 IMPLANT
SHEARS HARMONIC ACE PLUS 36CM (ENDOMECHANICALS) ×3 IMPLANT
SPONGE VERSALON 2X2 STRL (MISCELLANEOUS) ×1
SPONGE XRAY 4X4 16PLY STRL (MISCELLANEOUS) ×3 IMPLANT
STRIP CLOSURE SKIN 1/2X4 (GAUZE/BANDAGES/DRESSINGS) ×3 IMPLANT
STRIP CLOSURE SKIN 1/4X4 (GAUZE/BANDAGES/DRESSINGS) ×3 IMPLANT
SUT PDS AB 2-0 CT1 27 (SUTURE) ×3 IMPLANT
SUT VIC AB 0 CT1 27 (SUTURE) ×2
SUT VIC AB 0 CT1 27XCR 8 STRN (SUTURE) ×4 IMPLANT
SUT VIC AB 0 CT1 36 (SUTURE) ×9 IMPLANT
SUT VIC AB 2-0 UR6 27 (SUTURE) ×3 IMPLANT
SUT VIC AB 4-0 FS2 27 (SUTURE) ×3 IMPLANT
SUT VIC AB 4-0 SH 27 (SUTURE) ×1
SUT VIC AB 4-0 SH 27XANBCTRL (SUTURE) ×2 IMPLANT
SWABSTK COMLB BENZOIN TINCTURE (MISCELLANEOUS) ×3 IMPLANT
SYR 50ML LL SCALE MARK (SYRINGE) ×3 IMPLANT
SYR CONTROL 10ML (SYRINGE) ×3 IMPLANT
SYRINGE 10CC LL (SYRINGE) ×3 IMPLANT
TRANSPORE TAPE ×3 IMPLANT
TROCAR ENDO BLADELESS 11MM (ENDOMECHANICALS) ×3 IMPLANT
TROCAR XCEL NON-BLD 5MMX100MML (ENDOMECHANICALS) ×3 IMPLANT
TROCAR XCEL UNIV SLVE 11M 100M (ENDOMECHANICALS) ×3 IMPLANT
TUBING INSUFFLATOR HEATED (MISCELLANEOUS) ×3 IMPLANT
TUBING INSUFFLATOR HI FLOW (MISCELLANEOUS) ×3 IMPLANT

## 2015-05-21 NOTE — Anesthesia Procedure Notes (Signed)
Procedure Name: Intubation Date/Time: 05/21/2015 7:50 AM Performed by: Junious Silk Pre-anesthesia Checklist: Patient identified, Emergency Drugs available, Suction available, Patient being monitored and Timeout performed Patient Re-evaluated:Patient Re-evaluated prior to inductionOxygen Delivery Method: Circle system utilized Preoxygenation: Pre-oxygenation with 100% oxygen Intubation Type: IV induction Ventilation: Mask ventilation without difficulty Laryngoscope Size: Mac and 3 Grade View: Grade II Tube type: Oral Tube size: 7.0 mm Number of attempts: 1 Airway Equipment and Method: Stylet Placement Confirmation: ETT inserted through vocal cords under direct vision,  positive ETCO2 and breath sounds checked- equal and bilateral Secured at: 20 cm Tube secured with: Tape Dental Injury: Teeth and Oropharynx as per pre-operative assessment  Difficulty Due To: Difficulty was unanticipated

## 2015-05-21 NOTE — Op Note (Signed)
[facility] PROCEDURE DATE: 05/21/2015   PREOPERATIVE DIAGNOSIS: AUB, failed medical management  POSTOPERATIVE DIAGNOSIS: The same PROCEDURE: Laparoscopic assisted vaginal hysterectomy, bilateral salpingoophorectomy SURGEON:  Dr. Cline Cools  ASSISTANT: Dr. Ihor Austin Schermerhorn  INDICATIONS: 54 y.o. G0 with aforementioned preoperative diagnoses here today for definitive surgical management.   Risks of surgery were discussed with the patient including but not limited to: bleeding which may require transfusion or reoperation; infection which may require antibiotics; injury to bowel, bladder, ureters or other surrounding organs; need for additional procedures including laparotomy; thromboembolic phenomenon, incisional problems and other postoperative/anesthesia complications. Written informed consent was obtained.    FINDINGS:  Small uterus, normal adnexa bilaterally.  No evidence of endometriosis, adhesions or any other abdominal/pelvic abnormality.  Normal upper abdomen.  ANESTHESIA:    General INTRAVENOUS FLUIDS: 1400 ml ESTIMATED BLOOD LOSS: 50 ml URINE OUTPUT: SPECIMENS: Uterus, cervix, bilateral fallopian tubes and ovaries. COMPLICATIONS: None immediate  PROCEDURE IN DETAIL:  The patient received intravenous antibiotics and had sequential compression devices applied to her lower extremities while in the preoperative area.  She was then taken to the operating room where general anesthesia was administered and was found to be adequate.  She was placed in the dorsal lithotomy position, and was prepped and draped in a sterile manner.  A Foley catheter was inserted into her bladder and attached to constant drainage and a uterine manipulator was then advanced into the uterus.  After an adequate timeout was performed, attention was turned to the abdomen where an umbilical incision was made with the scalpel.  A 5-mm trocar and sleeve were then advanced without difficulty with the  laparoscope under direct visualization into the abdomen. The abdomen was then insufflated with carbon dioxide gas and adequate pneumoperitoneum was obtained. Bilateral 5-mm lower quadrant ports were then placed under direct visualization.  A survey of the patient's pelvis and abdomen revealed the findings as above.  The round ligaments were clamped and transected with the Ligasure device on both sides. The bilateral infundibulopelvic ligaments were also clamped and transected with the Ligasure device.  The bladder peritoneum was dissected away from the vaginal cuff. The uterus was followed to the uterine arteries, which were clamped, cauterized, and transected with the Ligasure device. Excellent hemostasis was noted, the decision was made to leave the trocars in place and proceed with completing the hysterectomy via the vaginal route .  Attention was then turned to her pelvis.  A weighted speculum was then placed in the vagina, and the anterior and posterior lips of the cervix were grasped bilaterally with tenaculums.  The cervix was then injected circumferentially with 0.5% Marcaine with epinephrine solution to maintain hemostasis.  The cervix was then circumferentially incised, and the posterior cul-de-sac was then entered sharply without difficulty and a long weighted speculum was placed.  The same procedure was performed anteriorly and the bladder was identified and sharply dissected off the cervix without difficulty.  The Heaney clamp was then used to clamp the uterosacral ligaments on either side.  They were then cut and sutured ligated with 0 Vicryl, and were held with a tag for later identification. Of note, all sutures used in this case were 0 Vicryl unless otherwise noted.   The cardinal ligaments were then clamped, cut and ligated bilaterally. The uterine vessels and broad ligaments were then serially clamped with the Heaney clamps, cut, and suture ligated on both sides.  The uterus, tubes and ovaries  were noted to be freed from  all ligaments and was then delivered and sent to pathology.   After completion of the hysterectomy, all pedicles from the uterosacral ligament to the cornua were examined hemostasis was confirmed.  The vaginal peritoneum was closed with 0-Vicryl suture. The vaginal cuff was then closed in a running locked fashion with 0 Vicryl with care given to incorporate the uterosacral pedicles bilaterally.  All instruments were then removed from the pelvis.  Attention was then returned to her abdomen which was insufflated again with carbon dioxide gas.  The laparoscope was used to survey the operative site, and it was found to be hemostatic.   No intraoperative injury to other surrounding organs was noted. The bladder was backfilled and noted to be intact. The abdomen was desufflated and all instruments were then removed from the patient's abdomen.   All skin incisions were closed with Dermabond. The patient tolerated the procedures well.  All instruments, needles, and sponge counts were correct x 2. The patient was taken to the recovery room awake, extubated and in stable condition.

## 2015-05-21 NOTE — Anesthesia Preprocedure Evaluation (Addendum)
Anesthesia Evaluation  Patient identified by MRN, date of birth, ID band Patient awake    Reviewed: Allergy & Precautions, NPO status , Patient's Chart, lab work & pertinent test results, reviewed documented beta blocker date and time   History of Anesthesia Complications (+) PONV  Airway Mallampati: II  TM Distance: >3 FB Neck ROM: Full    Dental no notable dental hx.    Pulmonary neg pulmonary ROS,  breath sounds clear to auscultation  Pulmonary exam normal       Cardiovascular Exercise Tolerance: Good hypertension, Pt. on medications and Pt. on home beta blockers Normal cardiovascular examRhythm:Regular Rate:Normal     Neuro/Psych  Headaches, negative psych ROS   GI/Hepatic negative GI ROS, Neg liver ROS,   Endo/Other  negative endocrine ROS  Renal/GU negative Renal ROS  negative genitourinary   Musculoskeletal negative musculoskeletal ROS (+)   Abdominal   Peds negative pediatric ROS (+)  Hematology negative hematology ROS (+)   Anesthesia Other Findings   Reproductive/Obstetrics negative OB ROS                            Anesthesia Physical Anesthesia Plan  ASA: II  Anesthesia Plan: General   Post-op Pain Management:    Induction: Intravenous  Airway Management Planned: Oral ETT  Additional Equipment:   Intra-op Plan:   Post-operative Plan: Extubation in OR  Informed Consent: I have reviewed the patients History and Physical, chart, labs and discussed the procedure including the risks, benefits and alternatives for the proposed anesthesia with the patient or authorized representative who has indicated his/her understanding and acceptance.   Dental advisory given  Plan Discussed with: CRNA and Surgeon  Anesthesia Plan Comments:         Anesthesia Quick Evaluation

## 2015-05-21 NOTE — Anesthesia Postprocedure Evaluation (Signed)
  Anesthesia Post-op Note  Patient: Kimberly Yang  Procedure(s) Performed: Procedure(s): LAPAROSCOPIC ASSISTED VAGINAL HYSTERECTOMY (N/A) LAPAROSCOPIC SALPINGO OOPHORECTOMY (Bilateral) CYSTOSCOPY (N/A)  Anesthesia type:General  Patient location: PACU  Post pain: Pain level controlled  Post assessment: Post-op Vital signs reviewed, Patient's Cardiovascular Status Stable, Respiratory Function Stable, Patent Airway and No signs of Nausea or vomiting  Post vital signs: Reviewed and stable  Last Vitals:  Filed Vitals:   05/21/15 1055  BP: 131/70  Pulse: 54  Temp:   Resp: 12    Level of consciousness: awake, alert  and patient cooperative  Complications: No apparent anesthesia complications

## 2015-05-21 NOTE — H&P (Signed)
  Date of Initial H&P: 04/10/15  History reviewed, patient examined, no change in status, stable for surgery.  Was treated for vaginal yeast infection this week, and has used Monistat over the last few days. Will treat aggressively postop.   She also is interested in something for nausea POD#1, and percocet might have made it worse. Will try po Dilaudid and NSAIDs instead.  Paper chart H&P completed and will be scanned into EMR.

## 2015-05-21 NOTE — Progress Notes (Signed)
While Changing Peri-pad noted small amount of bleeding, noticed catheter disconnected. Ports cleaned well with alcohol and reconnected. Draining clear yellow urine. Small amount of urine on chux

## 2015-05-21 NOTE — Transfer of Care (Signed)
Immediate Anesthesia Transfer of Care Note  Patient: Kimberly Yang  Procedure(s) Performed: Procedure(s): LAPAROSCOPIC ASSISTED VAGINAL HYSTERECTOMY (N/A) LAPAROSCOPIC SALPINGO OOPHORECTOMY (Bilateral) CYSTOSCOPY (N/A)  Patient Location: PACU  Anesthesia Type:General  Level of Consciousness: awake and oriented  Airway & Oxygen Therapy: Patient connected to face mask oxygen  Post-op Assessment: Report given to RN  Post vital signs: Reviewed and stable  Last Vitals:  Filed Vitals:   05/21/15 0616  BP: 153/91  Pulse: 65  Temp: 36.9 C  Resp: 16    Complications: No apparent anesthesia complications

## 2015-05-22 DIAGNOSIS — N800 Endometriosis of uterus: Secondary | ICD-10-CM | POA: Diagnosis not present

## 2015-05-22 DIAGNOSIS — Z4889 Encounter for other specified surgical aftercare: Secondary | ICD-10-CM

## 2015-05-22 LAB — CBC
HCT: 35.1 % (ref 35.0–47.0)
Hemoglobin: 11.6 g/dL — ABNORMAL LOW (ref 12.0–16.0)
MCH: 31.7 pg (ref 26.0–34.0)
MCHC: 33 g/dL (ref 32.0–36.0)
MCV: 96.1 fL (ref 80.0–100.0)
PLATELETS: 238 10*3/uL (ref 150–440)
RBC: 3.66 MIL/uL — AB (ref 3.80–5.20)
RDW: 13.3 % (ref 11.5–14.5)
WBC: 11.4 10*3/uL — ABNORMAL HIGH (ref 3.6–11.0)

## 2015-05-22 LAB — BASIC METABOLIC PANEL
Anion gap: 6 (ref 5–15)
BUN: 12 mg/dL (ref 6–20)
CO2: 24 mmol/L (ref 22–32)
Calcium: 7.9 mg/dL — ABNORMAL LOW (ref 8.9–10.3)
Chloride: 104 mmol/L (ref 101–111)
Creatinine, Ser: 0.7 mg/dL (ref 0.44–1.00)
Glucose, Bld: 114 mg/dL — ABNORMAL HIGH (ref 65–99)
Potassium: 3.8 mmol/L (ref 3.5–5.1)
Sodium: 134 mmol/L — ABNORMAL LOW (ref 135–145)

## 2015-05-22 LAB — SURGICAL PATHOLOGY

## 2015-05-22 MED ORDER — HYDROMORPHONE HCL 2 MG PO TABS: 2.0000 mg | ORAL_TABLET | ORAL | Status: DC | PRN

## 2015-05-22 MED ORDER — FLUCONAZOLE 100 MG PO TABS
150.0000 mg | ORAL_TABLET | Freq: Once | ORAL | Status: DC
  Filled 2015-05-22: qty 1

## 2015-05-22 MED ORDER — DOCUSATE SODIUM 100 MG PO CAPS: 100.0000 mg | ORAL_CAPSULE | Freq: Two times a day (BID) | ORAL | Status: DC | PRN

## 2015-05-22 MED ORDER — IBUPROFEN 600 MG PO TABS: 600.0000 mg | ORAL_TABLET | Freq: Four times a day (QID) | ORAL | Status: DC | PRN

## 2015-05-22 MED ORDER — ONDANSETRON HCL 4 MG PO TABS: 4.0000 mg | ORAL_TABLET | Freq: Four times a day (QID) | ORAL | Status: DC | PRN

## 2015-05-22 MED ORDER — PROMETHAZINE HCL 25 MG RE SUPP: 25.0000 mg | Freq: Four times a day (QID) | RECTAL | Status: DC | PRN

## 2015-05-22 MED ORDER — FLUCONAZOLE 150 MG PO TABS: 150.0000 mg | ORAL_TABLET | Freq: Once | ORAL | Status: DC

## 2015-05-22 MED ORDER — DOCUSATE SODIUM 100 MG PO CAPS
100.0000 mg | ORAL_CAPSULE | Freq: Two times a day (BID) | ORAL | Status: DC
Start: 2015-05-22 — End: 2015-07-09

## 2015-05-22 NOTE — Discharge Summary (Signed)
  The patient was admitted on the day of scheduled surgery and proceeded to the operating room as scheduled for the below stated procedure without complications, EBL 50cc.  (For complete operative information, please see operative report).  Postoperatively she was admitted to the floor. Pain was initially managed with iv Dilaudid which was transitioned to PO dilaudid when the patient was tolerating a regular diet on postoperative day number 1.  Postoperative labs were stable (Hgb 13.0 --> 11.6 and creatinine 0.71 --> 0.70).    Foley cath was discontinued on postoperative day number 1 and patient passed a voiding trial without difficulty.   Patient was felt to be stable for discharge on postoperative day number 1 when she was tolerating a regular diet, pain was controlled with po pain medications, and she was ambulating and voiding without difficulty. Vital signs were stable and physical exam remained benign throughout her hospital stay. Incision was clean,dry, and intact.  She will follow up per below for post-op check.  Rx given for Dilaudid, Zofran, Phenergen suppositories, Zofran and Colace prn.    She was given specific instructions and numbers to call in written and verbal format. She verbalized understanding, agrees with the plan of care, and all questions answered to her satisfaction.

## 2015-05-22 NOTE — Discharge Instructions (Signed)
Signs and Symptoms to Report °Call our office at [phone] if you have any of the following. ° °• Fever over 100.4 degrees or higher °• Severe stomach pain not relieved with pain medications °• Bright red bleeding that’s heavier than a period that does not slow with rest °• To go the bathroom a lot (frequency), you can’t hold your urine (urgency), or it hurts when you empty your bladder (urinate) °• Chest pain °• Shortness of breath °• Pain in the calves of your legs °• Severe nausea and vomiting not relieved with anti-nausea medications °• Signs of infection around your wounds, such as redness, hot to touch, swelling, green/yellow drainage (like pus), bad smelling discharge °• Any concerns ° °What You Can Expect after Surgery °• You may see some pink tinged, bloody fluid and bruising around the wound. This is normal. °• You may notice shoulder and neck pain. This is caused by the gas used during surgery to expand your abdomen so your surgeon could get to the uterus easier. °• You may have a sore throat because of the tube in your mouth during general anesthesia. This will go away in 2 to 3 days. °• You may have some stomach cramps. °• You may notice spotting on your panties. °• You may have pain around the incision sites. ° ° °Activities after Your Discharge °Follow these guidelines to help speed your recovery at home: °• Do the coughing and deep breathing as you did in the hospital for 2 weeks. Use the small blue breathing device, called the incentive spirometer for 2 weeks. °• Don’t drive if you are in pain or taking narcotic pain medicine. You may drive when you can safely slam on the brakes, turn the wheel forcefully, and rotate your torso comfortably. This is typically 1-2 weeks. Practice in a parking lot or side street prior to attempting to drive regularly.  °• Ask others to help with household chores for 4 weeks. °• Do not lift anything heavier that 10 pounds for 4-6 weeks. This includes pets,  children, and groceries. °• Don’t do strenuous activities, exercises, or sports like vacuuming, tennis, squash, etc. until your doctor says it is safe to do so. °---If you had a hysterectomy (abdominal, laparoscopic, or vaginal) do not have intercourse for 8-10 weeks.  °• Walk as you feel able. Rest often since it may take two or three weeks for your energy level to return to normal.  °• You may climb stairs °• Avoid constipation: °  -Eat fruits, vegetables, and whole grains. Eat small meals as your appetite will take time to return to normal. °  -Drink 6 to 8 glasses of water each day unless your doctor has told you to limit your fluids. °  -Use a laxative or stool softener as needed if constipation becomes a problem. You may take Miralax, metamucil, Citrucil, Colace, Senekot, FiberCon, etc. If this does not relieve the constipation, try two tablespoons of Milk Of Magnesia every 8 hours until your bowels move.  °• You may shower. Gently wash the wounds with a mild soap and water. Pat dry. °• Do not get in a hot tub, swimming pool, etc. until your doctor agrees. °• Do not use lotions, oils, powders on the wounds. °• Do not douche, use tampons, or have sex until your doctor says it is okay. °• Take your pain medicine when you need it. The medicine may not work as well if the pain is bad. ° °Take the medicines you were   taking before surgery. Other medications you will need are pain medications (Norco or Percocet) and nausea medications (Zofran).  ° °

## 2015-05-24 NOTE — Op Note (Signed)
Kimberly Yang PROCEDURE DATE: 05/24/2015   PREOPERATIVE DIAGNOSIS: AUB, failed medical management POSTOPERATIVE DIAGNOSIS: The same PROCEDURE: Laparoscopic assisted vaginal hysterectomy, bilateral salpingoophorectomy SURGEON:  Dr. Cline Cools  ASSISTANT: Dr. Ihor Austin Schermerhorn  INDICATIONS: 54 y.o. G0 with aforementioned preoperative diagnoses here today for definitive surgical management.   Risks of surgery were discussed with the patient including but not limited to: bleeding which may require transfusion or reoperation; infection which may require antibiotics; injury to bowel, bladder, ureters or other surrounding organs; need for additional procedures including laparotomy; thromboembolic phenomenon, incisional problems and other postoperative/anesthesia complications. Written informed consent was obtained.    FINDINGS:  Small uterus, normal adnexa bilaterally.  No evidence of endometriosis, adhesions or any other abdominal/pelvic abnormality.  Normal upper abdomen.  ANESTHESIA:    General INTRAVENOUS FLUIDS: 1400 ml ESTIMATED BLOOD LOSS: 50250 ml SPECIMENS: Uterus, cervix, bilateral fallopian tubes and ovaries. COMPLICATIONS: None immediate   Alternate method of LAVH/BSO : Laparoscopy down to taking the uterine vessels then vaginal completion.   PROCEDURE IN DETAIL:  The patient received intravenous antibiotics and had sequential compression devices applied to her lower extremities while in the preoperative area.  She was then taken to the operating room where general anesthesia was administered and was found to be adequate.  She was placed in the dorsal lithotomy position, and was prepped and draped in a sterile manner.  A Foley catheter was inserted into her bladder and attached to gravity drainage and a uterine manipulator was then advanced into the uterus.  After an adequate timeout was performed, attention was turned to the abdomen where an umbilical incision was made with  the scalpel.  A 5-mm trocar and sleeve were then advanced without difficulty with the laparoscope under direct visualization into the abdomen.  The abdomen was then insufflated with carbon dioxide gas and adequate pneumoperitoneum was obtained. Bilateral 5-mm lower quadrant ports were then placed under direct visualization.  A survey of the patient's pelvis and abdomen revealed the findings as above.  The round ligaments were clamped and transected with the Ligasure device on both sides. The bilateral infundibulopelvic ligaments were also clamped and transected with the Ligasure device.  The bladder peritoneum was dissected away from the vaginal cuff. The uterus was followed to the uterine arteries, which were clamped, cauterized, and transected with the Ligasure device. Excellent hemostasis was noted, the decision was made to leave the trocars in place and proceed with completing the hysterectomy via the vaginal route .  Attention was then turned to her pelvis.  A weighted speculum was then placed in the vagina, and the anterior and posterior lips of the cervix were grasped bilaterally with tenaculums.  The cervix was then injected circumferentially with 0.5% Marcaine with epinephrine solution to maintain hemostasis.  The cervix was then circumferentially incised, and the posterior cul-de-sac was then entered sharply without difficulty and a long weighted speculum was placed.  The same procedure was performed anteriorly and the bladder was identified and sharply dissected off the cervix without difficulty.  The Heaney clamp was then used to clamp the uterosacral ligaments on either side.  They were then cut and sutured ligated with 0 Vicryl, and were held with a tag for later identification. Of note, all sutures used in this case were 0 Vicryl unless otherwise noted.   The cardinal ligaments were then clamped, cut and ligated bilaterally. The uterine vessels and broad ligaments were then serially clamped with  the Heaney clamps, cut, and suture ligated  on both sides.  The uterus, tubes and ovaries were noted to be freed from all ligaments and was then delivered and sent to pathology.   After completion of the hysterectomy, all pedicles from the uterosacral ligament to the cornua were examined hemostasis was confirmed.  The vaginal peritoneum was closed with 0-Vicryl suture. The vaginal cuff was then closed in a running locked fashion with 0 Vicryl with care given to incorporate the uterosacral pedicles bilaterally.  All instruments were then removed from the pelvis.  Attention was then returned to her abdomen which was insufflated again with carbon dioxide gas.  The laparoscope was used to survey the operative site, and it was found to be hemostatic.   No intraoperative injury to other surrounding organs was noted. The bladder was backfilled and noted to be intact. The abdomen was desufflated and all instruments were then removed from the patient's abdomen.   All skin incisions were closed with Dermabond. The patient tolerated the procedures well.  All instruments, needles, and sponge counts were correct x 2. The patient was taken to the recovery room awake, extubated and in stable condition.

## 2015-06-05 DIAGNOSIS — N8003 Adenomyosis of the uterus: Secondary | ICD-10-CM | POA: Insufficient documentation

## 2015-06-05 DIAGNOSIS — N809 Endometriosis, unspecified: Secondary | ICD-10-CM

## 2015-07-09 ENCOUNTER — Encounter: Payer: Self-pay | Admitting: Family Medicine

## 2015-07-09 ENCOUNTER — Ambulatory Visit (INDEPENDENT_AMBULATORY_CARE_PROVIDER_SITE_OTHER): Payer: BLUE CROSS/BLUE SHIELD | Admitting: Family Medicine

## 2015-07-09 VITALS — BP 116/77 | HR 60 | Temp 98.8°F | Ht 63.0 in | Wt 166.0 lb

## 2015-07-09 DIAGNOSIS — E7800 Pure hypercholesterolemia, unspecified: Secondary | ICD-10-CM

## 2015-07-09 DIAGNOSIS — I10 Essential (primary) hypertension: Secondary | ICD-10-CM

## 2015-07-09 LAB — LP+ALT+AST PICCOLO, WAIVED
ALT (SGPT) Piccolo, Waived: 22 U/L (ref 10–47)
AST (SGOT) Piccolo, Waived: 27 U/L (ref 11–38)
CHOL/HDL RATIO PICCOLO,WAIVE: 1.9 mg/dL
CHOLESTEROL PICCOLO, WAIVED: 126 mg/dL (ref <200)
HDL Chol Piccolo, Waived: 66 mg/dL (ref >59)
LDL Chol Calc Piccolo Waived: 39 mg/dL (ref <100)
TRIGLYCERIDES PICCOLO,WAIVED: 108 mg/dL (ref <150)
VLDL Chol Calc Piccolo,Waive: 22 mg/dL (ref <30)

## 2015-07-09 MED ORDER — ATORVASTATIN CALCIUM 20 MG PO TABS: 20.0000 mg | ORAL_TABLET | Freq: Every day | ORAL | Status: DC

## 2015-07-09 MED ORDER — BISOPROLOL-HYDROCHLOROTHIAZIDE 5-6.25 MG PO TABS: 1.0000 | ORAL_TABLET | Freq: Every day | ORAL | Status: DC

## 2015-07-09 NOTE — Progress Notes (Signed)
BP 116/77 mmHg  Pulse 60  Temp(Src) 98.8 F (37.1 C)  Ht  (1.6 m)  Wt 166 lb (75.297 kg)  BMI 29.41 kg/m2  SpO2 99%  LMP 04/30/2015 (Exact Date)   Subjective:    Patient ID: Kimberly Yang, female    DOB: 1961-02-07, 54 y.o.   MRN: [id number]  HPI: Kimberly Yang is a 54 y.o. female  Chief Complaint  Patient presents with  . Medication Refill  . Hyperlipidemia  . Hypertension   Patient doing well with medications has completed hysterectomy and time off from work until next week when she has to go back. Has done very well with no complications is only taking cholesterol and blood pressure medicines now. Takes medications faithfully with no side effects  Relevant past medical, surgical, family and social history reviewed and updated as indicated. Interim medical history since our last visit reviewed. Allergies and medications reviewed and updated.  Review of Systems  Constitutional: Negative.   Respiratory: Negative.   Cardiovascular: Negative.     Per HPI unless specifically indicated above     Objective:    BP 116/77 mmHg  Pulse 60  Temp(Src) 98.8 F (37.1 C)  Ht  (1.6 m)  Wt 166 lb (75.297 kg)  BMI 29.41 kg/m2  SpO2 99%  LMP 04/30/2015 (Exact Date)  Wt Readings from Last 3 Encounters:  07/09/15 166 lb (75.297 kg)  05/21/15 160 lb (72.576 kg)  04/10/15 160 lb (72.576 kg)    Physical Exam  Constitutional: She appears well-developed and well-nourished.  Cardiovascular: Normal rate and regular rhythm.   Pulmonary/Chest: Effort normal and breath sounds normal.    Results for orders placed or performed during the hospital encounter of 05/21/15  CBC  Result Value Ref Range   WBC 11.4 (H) 3.6 - 11.0 K/uL   RBC 3.66 (L) 3.80 - 5.20 MIL/uL   Hemoglobin 11.6 (L) 12.0 - 16.0 g/dL   HCT 40.9 81.1 - 91.4 %   MCV 96.1 80.0 - 100.0 fL   MCH 31.7 26.0 - 34.0 pg   MCHC 33.0 32.0 - 36.0 g/dL   RDW 78.2 95.6 - 21.3 %   Platelets 238 150 - 440 K/uL  Basic  metabolic panel  Result Value Ref Range   Sodium 134 (L) 135 - 145 mmol/L   Potassium 3.8 3.5 - 5.1 mmol/L   Chloride 104 101 - 111 mmol/L   CO2 24 22 - 32 mmol/L   Glucose, Bld 114 (H) 65 - 99 mg/dL   BUN 12 6 - 20 mg/dL   Creatinine, Ser 0.86 0.44 - 1.00 mg/dL   Calcium 7.9 (L) 8.9 - 10.3 mg/dL   GFR calc non Af Amer >60 >60 mL/min   GFR calc Af Amer >60 >60 mL/min   Anion gap 6 5 - 15  Pregnancy, urine POC  Result Value Ref Range   Preg Test, Ur NEGATIVE NEGATIVE  Surgical pathology  Result Value Ref Range   SURGICAL PATHOLOGY      Surgical Pathology CASE: [id number] PATIENT: Hien Smoot Surgical Pathology Report     SPECIMEN SUBMITTED: A. Uterus with cervix, bilateral tubes and ovaries  CLINICAL HISTORY: None provided  PRE-OPERATIVE DIAGNOSIS: AUB  POST-OPERATIVE DIAGNOSIS: AUB     DIAGNOSIS: A. UTERUS WITH CERVIX, BILATERAL FALLOPIAN TUBES AND OVARIES; LAPAROSCOPIC-ASSISTED VAGINAL HYSTERECTOMY WITH BILATERAL SALPINGO-OOPHORECTOMY: - UNREMARKABLE CERVIX. - MYOMETRIUM EXTENSIVE ADENOMYOSIS. - INACTIVE ENDOMETRIUM. - BILATERAL OVARIES WITH STROMAL HYPERPLASIA - BILATERAL FALLOPIAN TUBES WITH BENIGN PARATUBAL  CYST. - NEGATIVE FOR MALIGNANCY.   GROSS DESCRIPTION: A. The specimen is received in a formalin-filled container labeled with the patient's name and uterus with cervix, bilateral tubes and ovaries.  Adnexa: Bilateral Weight: Uterus and cervix 84 grams Dimensions:      Fundus -5.7 x 6.2 x 3 cm      Cervix -4.5 x 3.8 x 2.7 cm Serosa: Dusky red and smooth Cervix: Pi nk-tan and soft Endocervix: Unremarkable Endometrial cavity:      Dimensions -3 x 2.5 x 0.3 cm      Thickness -0.1-0.2 cm      Other findings -there are 2 raised cysts noted ranging from 0.2-0.3cm Myometrium:     Thickness -up to 2 cm     Other findings -there are scattered cystic spaces from 0.1-0.3 cm in diameter, containing hemorrhagic fluid. Adnexa:      Right ovary            Weight -with fallopian tube 7 grams           Measurement -3 x 1.5 x 0.5 cm           Serosa -pink purple and relatively smooth           Cut surface -somewhat hemorrhagic but otherwise unremarkable      Right fallopian tube           Measurements -5.7 cm in length x 0.5-0.7 cm in diameter           Other findings -portion of fallopian tube with numerous paratubal cysts from 0.1-0.6 cm in diameter, containing clear fluid.      Left ovary            Weight -with fallopian tube 7 grams           Measurement -3.6 x 1.8 x 0.7 cm           Serosa pink purple and relatively smooth            Cut surface -hemorrhagic but otherwise unremarkable      Left fallopian tube            Measurements -7 cm in length x 0.5-0.7 cm in diameter           Other findings -portion of fallopian tube with numerous paratubal cysts from less than 0.1 up to 0.3 cm in diameter, containing clear fluid  Block summary: 1-2- cervix 3-lower uterine segments 4-5-anterior endomyometrium with cystic myometrium 6-7 posterior endomyometrium with cystic myometrium 8- right ovary 9-10- entire longitudinally sectioned fimbriated end of right fallopian tube and representative cross-sections 11-left ovary 12-13-entire longitudinally sectioned fimbriated end of left fallopian tube and representative cross-sections    Final Diagnosis performed by Elijah Birk, MD.  Electronically signed 05/22/2015 10:02:54AM    The electronic signature indicates that the named Attending Pathologist has evaluated the specimen  Technical component performed at [facility], [address] [place], [place] [address] Lab: [phone] Dir: Titus Dubin. Cato Mulligan, MD  Professional component performed at [facility], [facility], [address] [place], [place], [place] [address] Lab: [phone] Dir: Georgiann Cocker. Rubinas, MD        Assessment & Plan:   Problem List Items Addressed This Visit    None    Visit Diagnoses    Pure  hypercholesterolemia    -  Primary    Relevant Medications    atorvastatin (LIPITOR) 20 MG tablet    bisoprolol-hydrochlorothiazide (ZIAC) 5-6.25 MG per tablet    Other  Relevant Orders    LP+ALT+AST Piccolo, Waived    Essential hypertension, benign        Relevant Medications    atorvastatin (LIPITOR) 20 MG tablet    bisoprolol-hydrochlorothiazide (ZIAC) 5-6.25 MG per tablet    Other Relevant Orders    Basic metabolic panel        Follow up plan: Return in about 6 months (around 01/09/2016), or if symptoms worsen or fail to improve, for Physical Exam.

## 2015-07-10 LAB — BASIC METABOLIC PANEL
BUN/Creatinine Ratio: 9 (ref 9–23)
BUN: 7 mg/dL (ref 6–24)
CO2: 27 mmol/L (ref 18–29)
Calcium: 9.5 mg/dL (ref 8.7–10.2)
Chloride: 101 mmol/L (ref 97–108)
Creatinine, Ser: 0.74 mg/dL (ref 0.57–1.00)
GFR calc Af Amer: 105 mL/min/{1.73_m2} (ref >59)
GFR calc non Af Amer: 91 mL/min/{1.73_m2} (ref >59)
Glucose: 96 mg/dL (ref 65–99)
POTASSIUM: 4.8 mmol/L (ref 3.5–5.2)
Sodium: 143 mmol/L (ref 134–144)

## 2015-07-10 NOTE — Progress Notes (Signed)
Phone call with patient discussed calcium is low Patient will take calcium supplements Recheck BMP calcium 1 month

## 2015-07-10 NOTE — Addendum Note (Signed)
Addended byVonita Moss on: 07/10/2015 12:30 PM   Modules accepted: Orders

## 2015-08-07 ENCOUNTER — Other Ambulatory Visit: Payer: BLUE CROSS/BLUE SHIELD

## 2015-08-08 LAB — BASIC METABOLIC PANEL
BUN / CREAT RATIO: 13 (ref 9–23)
BUN: 12 mg/dL (ref 6–24)
CHLORIDE: 96 mmol/L — AB (ref 97–108)
CO2: 28 mmol/L (ref 18–29)
Calcium: 9.6 mg/dL (ref 8.7–10.2)
Creatinine, Ser: 0.93 mg/dL (ref 0.57–1.00)
GFR calc Af Amer: 79 mL/min/{1.73_m2} (ref >59)
GFR, EST NON AFRICAN AMERICAN: 69 mL/min/{1.73_m2} (ref >59)
Glucose: 87 mg/dL (ref 65–99)
Potassium: 4.7 mmol/L (ref 3.5–5.2)
Sodium: 138 mmol/L (ref 134–144)

## 2015-09-03 ENCOUNTER — Telehealth: Payer: Self-pay | Admitting: Family Medicine

## 2015-09-03 NOTE — Telephone Encounter (Signed)
Pt called would like a call back concerning the lab work from her last visit. Pt stated no one has ever called her regarding this. Please call her ASAP. Thanks.

## 2015-09-04 ENCOUNTER — Ambulatory Visit
Admission: EM | Admit: 2015-09-04 | Discharge: 2015-09-04 | Disposition: A | Discharge status: Home / Self Care | Payer: BLUE CROSS/BLUE SHIELD | Attending: Family Medicine | Admitting: Family Medicine

## 2015-09-04 ENCOUNTER — Encounter: Payer: Self-pay | Admitting: Emergency Medicine

## 2015-09-04 DIAGNOSIS — N390 Urinary tract infection, site not specified: Secondary | ICD-10-CM | POA: Diagnosis not present

## 2015-09-04 DIAGNOSIS — J0100 Acute maxillary sinusitis, unspecified: Secondary | ICD-10-CM

## 2015-09-04 LAB — URINALYSIS COMPLETE WITH MICROSCOPIC (ARMC ONLY)
BILIRUBIN URINE: NEGATIVE
Glucose, UA: NEGATIVE mg/dL
Nitrite: NEGATIVE
Protein, ur: NEGATIVE mg/dL
RBC / HPF: NONE SEEN RBC/hpf (ref <3)
Specific Gravity, Urine: 1.01 (ref 1.005–1.030)
pH: 6.5 (ref 5.0–8.0)

## 2015-09-04 MED ORDER — FEXOFENADINE-PSEUDOEPHED ER 180-240 MG PO TB24: 1.0000 | ORAL_TABLET | Freq: Every day | ORAL | Status: DC

## 2015-09-04 MED ORDER — CEFUROXIME AXETIL 500 MG PO TABS: 500.0000 mg | ORAL_TABLET | Freq: Two times a day (BID) | ORAL | Status: DC

## 2015-09-04 MED ORDER — FLUCONAZOLE 150 MG PO TABS: 150.0000 mg | ORAL_TABLET | Freq: Once | ORAL | Status: DC

## 2015-09-04 MED ORDER — PHENAZOPYRIDINE HCL 200 MG PO TABS: 200.0000 mg | ORAL_TABLET | Freq: Three times a day (TID) | ORAL | Status: DC | PRN

## 2015-09-04 NOTE — ED Notes (Signed)
Patient c/o sinus pain and pressure for a week.  Patient c/o burning when urinating for over a week.  Patient reports fevers.

## 2015-09-04 NOTE — ED Provider Notes (Signed)
CSN: [id number]     Arrival date & time 09/04/15  1339 History   First MD Initiated Contact with Patient 09/04/15 1409     Chief Complaint  Patient presents with  . Facial Pain  . Dysuria   patient was seen here today because of UTI symptoms that started from Friday roughly 5 days ago and the sinus infection started about 9 days ago. She doesn't get many UTIs has been a while since she's had one (Consider location/radiation/quality/duration/timing/severity/associated sxs/prior Treatment) Patient is a 54 y.o. female presenting with dysuria and URI. The history is provided by the patient. No language interpreter was used.  Dysuria Pain quality:  Aching and shooting Pain severity:  Moderate Onset quality:  Gradual Timing:  Constant Chronicity:  New Recent urinary tract infections: no   Relieved by:  Nothing Urinary symptoms: foul-smelling urine and frequent urination   Urinary symptoms: no hematuria   Associated symptoms: fever   Associated symptoms: no flank pain and no vaginal discharge   Risk factors: no hx of pyelonephritis, no kidney transplant, not pregnant, no recurrent urinary tract infections and no renal disease   URI Presenting symptoms: congestion, fever and rhinorrhea   Rhinorrhea:    Quality:  Clear   Severity:  Moderate   Duration:  9 days   Timing:  Constant   Progression:  Worsening Severity:  Moderate Onset quality:  Sudden Progression:  Worsening Chronicity:  New Relieved by:  Nothing Ineffective treatments:  Decongestant and OTC medications Associated symptoms: sinus pain   Associated symptoms: no arthralgias and no wheezing   Risk factors: not elderly and no chronic cardiac disease    past medical history reviewed as well as nurse's notes reviewed and patient does not smoke.  Past Medical History  Diagnosis Date  . Hypertension   . Hyperlipidemia   . Allergic rhinitis   . Vaginitis   . Sinusitis   . Headache    Past Surgical History  Procedure  Laterality Date  . Nasal sinus surgery    . Colonoscopy    . Hysteroscopy w/d&c N/A 04/20/2015    Procedure: DILATATION AND CURETTAGE /HYSTEROSCOPY;  Surgeon: Christeen Douglas, MD;  Location: [facility] ORS;  Service: Gynecology;  Laterality: N/A;  . Laparoscopic assisted vaginal hysterectomy N/A 05/21/2015    Procedure: LAPAROSCOPIC ASSISTED VAGINAL HYSTERECTOMY;  Surgeon: Christeen Douglas, MD;  Location: [facility] OR[facility];  Service: Gynecology;  Laterality: N/A;  . Laparoscopic salpingo oopherectomy Bilateral 05/21/2015    Procedure: LAPAROSCOPIC SALPINGO OOPHORECTOMY;  Surgeon: Christeen Douglas, MD;  Location: [facility] OR[facility];  Service: Gynecology;  Laterality: Bilateral;  . Cystoscopy N/A 05/21/2015    Procedure: CYSTOSCOPY;  Surgeon: Christeen Douglas, MD;  Location: [facility] ORS;  Service: Gynecology;  Laterality: N/A;  . Abdominal hysterectomy     History reviewed. No pertinent family history. Social History  Substance Use Topics  . Smoking status: Never Smoker   . Smokeless tobacco: Never Used  . Alcohol Use: No   OB History    No data available     Review of Systems  Constitutional: Positive for fever.  HENT: Positive for congestion, facial swelling, rhinorrhea and sinus pressure.   Respiratory: Negative for wheezing.   Genitourinary: Positive for dysuria and frequency. Negative for flank pain and vaginal discharge.  Musculoskeletal: Negative for arthralgias.  All other systems reviewed and are negative.   Allergies  Review of patient's allergies indicates no known allergies.  Home Medications   Prior to Admission medications   Medication Sig Start Date End Date  Taking? Authorizing Provider  aspirin EC 81 MG tablet Take 81 mg by mouth daily.    Historical Provider, MD  atorvastatin (LIPITOR) 20 MG tablet Take 1 tablet (20 mg total) by mouth at bedtime. 07/09/15   Steele Sizer, MD  bisoprolol-hydrochlorothiazide (ZIAC) 5-6.25 MG per tablet Take 1 tablet by mouth daily. 07/09/15   Steele Sizer, MD   cefUROXime (CEFTIN) 500 MG tablet Take 1 tablet (500 mg total) by mouth 2 (two) times daily. 09/04/15   Hassan Rowan, MD  cetirizine (ZYRTEC) 10 MG tablet Take 10 mg by mouth daily as needed for allergies.    Historical Provider, MD  cholecalciferol (VITAMIN D) 1000 UNITS tablet Take 1,000 Units by mouth daily.    Historical Provider, MD  fexofenadine-pseudoephedrine (ALLEGRA-D ALLERGY & CONGESTION) 180-240 MG 24 hr tablet Take 1 tablet by mouth daily. 09/04/15   Hassan Rowan, MD  fluconazole (DIFLUCAN) 150 MG tablet Take 1 tablet (150 mg total) by mouth once. 09/04/15   Hassan Rowan, MD  phenazopyridine (PYRIDIUM) 200 MG tablet Take 1 tablet (200 mg total) by mouth 3 (three) times daily as needed for pain. 09/04/15   Hassan Rowan, MD   Meds Ordered and Administered this Visit  Medications - No data to display  BP 124/80 mmHg  Pulse 88  Temp(Src) 100.2 F (37.9 C) (Tympanic)  Resp 16  Ht  (1.575 m)  Wt 160 lb (72.576 kg)  BMI 29.26 kg/m2  SpO2 100%  LMP 04/30/2015 (Exact Date) No data found.   Physical Exam  Constitutional: She is oriented to person, place, and time. She appears well-developed and well-nourished.  HENT:  Head: Normocephalic and atraumatic.  Right Ear: Hearing, tympanic membrane, external ear and ear canal normal.  Left Ear: Hearing, tympanic membrane, external ear and ear canal normal.  Nose: Mucosal edema and rhinorrhea present. Right sinus exhibits maxillary sinus tenderness.  Mouth/Throat: She does not have dentures. Normal dentition. No lacerations or dental caries. Posterior oropharyngeal erythema present.  Eyes: Conjunctivae are normal. Pupils are equal, round, and reactive to light.  Neck: Normal range of motion. Neck supple. No thyromegaly present.  Abdominal: Soft. Bowel sounds are normal. She exhibits no distension. There is no tenderness. There is no CVA tenderness.  Musculoskeletal: Normal range of motion.  Neurological: She is alert and oriented to  person, place, and time.  Skin: Skin is warm and dry.  Psychiatric: She has a normal mood and affect. Her behavior is normal.  Vitals reviewed.   ED Course  Procedures (including critical care time)  Labs Review Labs Reviewed  URINALYSIS COMPLETEWITH MICROSCOPIC ([facility] ONLY) - Abnormal; Notable for the following:    APPearance CLOUDY (*)    Ketones, ur TRACE (*)    Hgb urine dipstick 1+ (*)    Leukocytes, UA 1+ (*)    Bacteria, UA MANY (*)    Squamous Epithelial / LPF 6-30 (*)    All other components within normal limits  URINE CULTURE    Imaging Review No results found.   Visual Acuity Review  Right Eye Distance:   Left Eye Distance:   Bilateral Distance:    Right Eye Near:   Left Eye Near:    Bilateral Near:         MDM   1. UTI (lower urinary tract infection)   2. Acute maxillary sinusitis, recurrence not specified     Treatment both infections will place on Ceftin 500 mg 1 tablet twice a day Diflucan for yeast  in and Allegra-D for congestion. Also  Pyridium for relief of the UTI.   Hassan Rowan, MD 09/04/15 [phone]

## 2015-09-04 NOTE — Discharge Instructions (Signed)
Upper Respiratory Infection, Adult An upper respiratory infection (URI) is also known as the common cold. It is often caused by a type of germ (virus). Colds are easily spread (contagious). You can pass it to others by kissing, coughing, sneezing, or drinking out of the same glass. Usually, you get better in 1 or 2 weeks.  HOME CARE   Only take medicine as told by your doctor.  Use a warm mist humidifier or breathe in steam from a hot shower.  Drink enough water and fluids to keep your pee (urine) clear or pale yellow.  Get plenty of rest.  Return to work when your temperature is back to normal or as told by your doctor. You may use a face mask and wash your hands to stop your cold from spreading. GET HELP RIGHT AWAY IF:   After the first few days, you feel you are getting worse.  You have questions about your medicine.  You have chills, shortness of breath, or brown or red spit (mucus).  You have yellow or brown snot (nasal discharge) or pain in the face, especially when you bend forward.  You have a fever, puffy (swollen) neck, pain when you swallow, or white spots in the back of your throat.  You have a bad headache, ear pain, sinus pain, or chest pain.  You have a high-pitched whistling sound when you breathe in and out (wheezing).  You have a lasting cough or cough up blood.  You have sore muscles or a stiff neck. MAKE SURE YOU:   Understand these instructions.  Will watch your condition.  Will get help right away if you are not doing well or get worse. Document Released: 05/05/2008 Document Revised: 02/09/2012 Document Reviewed: 02/22/2014 [facility] Patient Information 2015 [place], [place]. This information is not intended to replace advice given to you by your health care provider. Make sure you discuss any questions you have with your health care provider.  Urinary Tract Infection A urinary tract infection (UTI) can occur any place along the urinary tract. The tract  includes the kidneys, ureters, bladder, and urethra. A type of germ called bacteria often causes a UTI. UTIs are often helped with antibiotic medicine.  HOME CARE   If given, take antibiotics as told by your doctor. Finish them even if you start to feel better.  Drink enough fluids to keep your pee (urine) clear or pale yellow.  Avoid tea, drinks with caffeine, and bubbly (carbonated) drinks.  Pee often. Avoid holding your pee in for a long time.  Pee before and after having sex (intercourse).  Wipe from front to back after you poop (bowel movement) if you are a woman. Use each tissue only once. GET HELP RIGHT AWAY IF:   You have back pain.  You have lower belly (abdominal) pain.  You have chills.  You feel sick to your stomach (nauseous).  You throw up (vomit).  Your burning or discomfort with peeing does not go away.  You have a fever.  Your symptoms are not better in 3 days. MAKE SURE YOU:   Understand these instructions.  Will watch your condition.  Will get help right away if you are not doing well or get worse. Document Released: 05/05/2008 Document Revised: 08/11/2012 Document Reviewed: 06/17/2012 [facility] Patient Information 2015 [place], [place]. This information is not intended to replace advice given to you by your health care provider. Make sure you discuss any questions you have with your health care provider.  Sinusitis Sinusitis is  redness, soreness, and puffiness (inflammation) of the air pockets in the bones of your face (sinuses). The redness, soreness, and puffiness can cause air and mucus to get trapped in your sinuses. This can allow germs to grow and cause an infection.  HOME CARE   Drink enough fluids to keep your pee (urine) clear or pale yellow.  Use a humidifier in your home.  Run a hot shower to create steam in the bathroom. Sit in the bathroom with the door closed. Breathe in the steam 3-4 times a day.  Put a warm, moist washcloth on your  face 3-4 times a day, or as told by your doctor.  Use salt water sprays (saline sprays) to wet the thick fluid in your nose. This can help the sinuses drain.  Only take medicine as told by your doctor. GET HELP RIGHT AWAY IF:   Your pain gets worse.  You have very bad headaches.  You are sick to your stomach (nauseous).  You throw up (vomit).  You are very sleepy (drowsy) all the time.  Your face is puffy (swollen).  Your vision changes.  You have a stiff neck.  You have trouble breathing. MAKE SURE YOU:   Understand these instructions.  Will watch your condition.  Will get help right away if you are not doing well or get worse. Document Released: 05/05/2008 Document Revised: 08/11/2012 Document Reviewed: 06/22/2012 [facility] Patient Information 2015 [place], [place]. This information is not intended to replace advice given to you by your health care provider. Make sure you discuss any questions you have with your health care provider.

## 2015-09-06 LAB — URINE CULTURE
Culture: >=100000
Special Requests: NORMAL

## 2015-09-28 ENCOUNTER — Telehealth: Payer: Self-pay | Admitting: Family Medicine

## 2015-09-28 ENCOUNTER — Ambulatory Visit (INDEPENDENT_AMBULATORY_CARE_PROVIDER_SITE_OTHER): Payer: BC Managed Care – PPO | Admitting: Family Medicine

## 2015-09-28 ENCOUNTER — Encounter: Payer: Self-pay | Admitting: Family Medicine

## 2015-09-28 VITALS — BP 126/84 | HR 65 | Temp 98.9°F | Ht 62.3 in | Wt 159.0 lb

## 2015-09-28 DIAGNOSIS — J040 Acute laryngitis: Secondary | ICD-10-CM

## 2015-09-28 MED ORDER — PREDNISONE 10 MG PO TABS: ORAL_TABLET | ORAL | Status: DC

## 2015-09-28 MED ORDER — AMOXICILLIN-POT CLAVULANATE 875-125 MG PO TABS: 1.0000 | ORAL_TABLET | Freq: Two times a day (BID) | ORAL | Status: DC

## 2015-09-28 MED ORDER — FLUCONAZOLE 150 MG PO TABS: 150.0000 mg | ORAL_TABLET | Freq: Once | ORAL | Status: DC

## 2015-09-28 NOTE — Telephone Encounter (Signed)
Pt added to [facility]'s schedule today for an acute visit. Thanks.

## 2015-09-28 NOTE — Progress Notes (Signed)
BP 126/84 mmHg  Pulse 65  Temp(Src) 98.9 F (37.2 C)  Ht 5' 2.3" (1.582 m)  Wt 159 lb (72.122 kg)  BMI 28.82 kg/m2  SpO2 99%  LMP 04/30/2015 (Exact Date)   Subjective:    Patient ID: Kimberly Yang, female    DOB: February 26, 1961, 54 y.o.   MRN: [id number]  HPI: Kimberly Yang is a 54 y.o. female  Chief Complaint  Patient presents with  . URI    X 1 week, patient states that she is having a lot of pressure in her head, a cough and she lost her voice for several days.   UPPER RESPIRATORY TRACT INFECTION x1.5 week Worst symptom: laryngitis Fever: no Cough: yes Shortness of breath: no Wheezing: no Chest pain: no Chest tightness: no Chest congestion: no Nasal congestion: yes Runny nose: no Post nasal drip: yes Sneezing: no Sore throat: yes Swollen glands: yes Sinus pressure: yes Headache: yes Face pain: yes Toothache: no Ear pain: no  Ear pressure: no  Eyes red/itching:no Eye drainage/crusting: no  Vomiting: no Rash: no Fatigue: yes Sick contacts: yes Strep contacts: no  Context: worse Recurrent sinusitis: no Relief with OTC cold/cough medications: no  Treatments attempted: cold/sinus, mucinex, anti-histamine and pseudoephedrine   Relevant past medical, surgical, family and social history reviewed and updated as indicated. Interim medical history since our last visit reviewed. Allergies and medications reviewed and updated.  Review of Systems  Constitutional: Negative.   HENT: Negative.   Respiratory: Negative.   Cardiovascular: Negative.   Psychiatric/Behavioral: Negative.     Per HPI unless specifically indicated above     Objective:    BP 126/84 mmHg  Pulse 65  Temp(Src) 98.9 F (37.2 C)  Ht 5' 2.3" (1.582 m)  Wt 159 lb (72.122 kg)  BMI 28.82 kg/m2  SpO2 99%  LMP 04/30/2015 (Exact Date)  Wt Readings from Last 3 Encounters:  09/28/15 159 lb (72.122 kg)  09/04/15 160 lb (72.576 kg)  07/09/15 166 lb (75.297 kg)    Physical Exam  Constitutional:  She is oriented to person, place, and time. She appears well-developed and well-nourished. No distress.  Hoarse voice  HENT:  Head: Normocephalic and atraumatic.  Right Ear: Hearing and external ear normal.  Left Ear: Hearing and external ear normal.  Nose: Nose normal.  Mouth/Throat: Oropharynx is clear and moist. No oropharyngeal exudate.  Eyes: Conjunctivae, EOM and lids are normal. Pupils are equal, round, and reactive to light. Right eye exhibits no discharge. Left eye exhibits no discharge. No scleral icterus.  Neck: Normal range of motion. Neck supple. No JVD present. No tracheal deviation present. No thyromegaly present.  Cardiovascular: Normal rate, regular rhythm, normal heart sounds and intact distal pulses.  Exam reveals no gallop and no friction rub.   No murmur heard. Pulmonary/Chest: Effort normal and breath sounds normal. No stridor. No respiratory distress. She has no wheezes. She has no rales. She exhibits no tenderness.  Musculoskeletal: Normal range of motion.  Lymphadenopathy:    She has cervical adenopathy.  Neurological: She is alert and oriented to person, place, and time.  Skin: Skin is intact. No rash noted. She is not diaphoretic.  Psychiatric: She has a normal mood and affect. Her speech is normal and behavior is normal. Judgment and thought content normal. Cognition and memory are normal.  Nursing note and vitals reviewed.     Assessment & Plan:   Problem List Items Addressed This Visit    None    Visit Diagnoses  Laryngitis    -  Primary    Will treat with prednisone taper. If not better in 2 days, will start augmentin. Diflucan given if needed. Call if not getting better or getting worse.         Follow up plan: Return if symptoms worsen or fail to improve.

## 2016-01-10 ENCOUNTER — Other Ambulatory Visit: Payer: Self-pay | Admitting: Family Medicine

## 2016-01-10 ENCOUNTER — Ambulatory Visit (INDEPENDENT_AMBULATORY_CARE_PROVIDER_SITE_OTHER): Payer: BC Managed Care – PPO | Admitting: Family Medicine

## 2016-01-10 ENCOUNTER — Encounter: Payer: Self-pay | Admitting: Family Medicine

## 2016-01-10 VITALS — BP 126/76 | HR 64 | Temp 97.6°F | Ht 62.2 in | Wt 150.0 lb

## 2016-01-10 DIAGNOSIS — Z1239 Encounter for other screening for malignant neoplasm of breast: Secondary | ICD-10-CM | POA: Diagnosis not present

## 2016-01-10 DIAGNOSIS — I10 Essential (primary) hypertension: Secondary | ICD-10-CM

## 2016-01-10 DIAGNOSIS — E785 Hyperlipidemia, unspecified: Secondary | ICD-10-CM

## 2016-01-10 DIAGNOSIS — Z0000 Encounter for general adult medical examination without abnormal findings: Secondary | ICD-10-CM | POA: Diagnosis not present

## 2016-01-10 DIAGNOSIS — Z113 Encounter for screening for infections with a predominantly sexual mode of transmission: Secondary | ICD-10-CM

## 2016-01-10 LAB — URINALYSIS, ROUTINE W REFLEX MICROSCOPIC
BILIRUBIN UA: NEGATIVE
GLUCOSE, UA: NEGATIVE
KETONES UA: NEGATIVE
NITRITE UA: NEGATIVE
Protein, UA: NEGATIVE
RBC UA: NEGATIVE
SPEC GRAV UA: 1.015 (ref 1.005–1.030)
UUROB: 0.2 mg/dL (ref 0.2–1.0)
pH, UA: 6.5 (ref 5.0–7.5)

## 2016-01-10 LAB — MICROSCOPIC EXAMINATION

## 2016-01-10 MED ORDER — BISOPROLOL-HYDROCHLOROTHIAZIDE 5-6.25 MG PO TABS: 1.0000 | ORAL_TABLET | Freq: Every day | ORAL | Status: DC

## 2016-01-10 MED ORDER — PREDNISONE 10 MG PO TABS: ORAL_TABLET | ORAL | Status: DC

## 2016-01-10 MED ORDER — ATORVASTATIN CALCIUM 20 MG PO TABS: 20.0000 mg | ORAL_TABLET | Freq: Every day | ORAL | Status: DC

## 2016-01-10 NOTE — Progress Notes (Signed)
BP 126/76 mmHg  Pulse 64  Temp(Src) 97.6 F (36.4 C)  Ht 5' 2.2" (1.58 m)  Wt 150 lb (68.04 kg)  BMI 27.26 kg/m2  SpO2 99%  LMP 04/30/2015 (Exact Date)   Subjective:    Patient ID: Kimberly Yang, female    DOB: May 18, 1961, 55 y.o.   MRN: [id number]  HPI: Kimberly Yang is a 55 y.o. female  Chief Complaint  Patient presents with  . Annual Exam   doing really well especially after hysterectomy  atorvastatin and blood pressure medicines without problems or concerns good control   Relevant past medical, surgical, family and social history reviewed and updated as indicated. Interim medical history since our last visit reviewed. Allergies and medications reviewed and updated.  Review of Systems  Constitutional: Negative.   HENT: Negative.   Eyes: Negative.   Respiratory: Negative.   Cardiovascular: Negative.   Gastrointestinal: Negative.   Endocrine: Negative.   Genitourinary: Negative.   Musculoskeletal: Negative.   Skin: Negative.   Allergic/Immunologic: Negative.   Neurological: Negative.   Hematological: Negative.   Psychiatric/Behavioral: Negative.     Per HPI unless specifically indicated above     Objective:    BP 126/76 mmHg  Pulse 64  Temp(Src) 97.6 F (36.4 C)  Ht 5' 2.2" (1.58 m)  Wt 150 lb (68.04 kg)  BMI 27.26 kg/m2  SpO2 99%  LMP 04/30/2015 (Exact Date)  Wt Readings from Last 3 Encounters:  01/10/16 150 lb (68.04 kg)  09/28/15 159 lb (72.122 kg)  09/04/15 160 lb (72.576 kg)    Physical Exam  Constitutional: She is oriented to person, place, and time. She appears well-developed and well-nourished.  HENT:  Head: Normocephalic and atraumatic.  Right Ear: External ear normal.  Left Ear: External ear normal.  Nose: Nose normal.  Mouth/Throat: Oropharynx is clear and moist.  Eyes: Conjunctivae and EOM are normal. Pupils are equal, round, and reactive to light.  Neck: Normal range of motion. Neck supple. Carotid bruit is not present.   Cardiovascular: Normal rate, regular rhythm and normal heart sounds.   No murmur heard. Pulmonary/Chest: Effort normal and breath sounds normal. She exhibits no mass. Right breast exhibits no mass, no skin change and no tenderness. Left breast exhibits no mass, no skin change and no tenderness. Breasts are symmetrical.  Abdominal: Soft. Bowel sounds are normal. There is no hepatosplenomegaly.  Musculoskeletal: Normal range of motion.  Neurological: She is alert and oriented to person, place, and time.  Skin: No rash noted.  Psychiatric: She has a normal mood and affect. Her behavior is normal. Judgment and thought content normal.        Assessment & Plan:   Problem List Items Addressed This Visit      Cardiovascular and Mediastinum   Essential hypertension    The current medical regimen is effective;  continue present plan and medications.       Relevant Medications   bisoprolol-hydrochlorothiazide (ZIAC) 5-6.25 MG tablet   atorvastatin (LIPITOR) 20 MG tablet     Other   Hyperlipidemia    The current medical regimen is effective;  continue present plan and medications.       Relevant Medications   bisoprolol-hydrochlorothiazide (ZIAC) 5-6.25 MG tablet   atorvastatin (LIPITOR) 20 MG tablet    Other Visit Diagnoses    Routine general medical examination at a health care facility    -  Primary    Relevant Orders    CBC with Differential/Platelet  Comprehensive metabolic panel    Lipid Panel w/o Chol/HDL Ratio    TSH    Urinalysis, Routine w reflex microscopic (not at [facility])    Routine screening for STI (sexually transmitted infection)        Relevant Orders    Hepatitis C Antibody    Breast cancer screening        Relevant Orders    MM Digital Screening       Patient has rare but intermittent low back spasms that are helped with prednisone wants to have a prednisone prescription on standby which was given. Follow up plan: Return in about 6 months (around  07/09/2016) for BMP, lipid panel, ALT, AST.

## 2016-01-10 NOTE — Assessment & Plan Note (Signed)
The current medical regimen is effective;  continue present plan and medications.  

## 2016-01-11 LAB — LIPID PANEL W/O CHOL/HDL RATIO
Cholesterol, Total: 130 mg/dL (ref 100–199)
HDL: 66 mg/dL (ref >39)
LDL CALC: 53 mg/dL (ref 0–99)
Triglycerides: 57 mg/dL (ref 0–149)
VLDL Cholesterol Cal: 11 mg/dL (ref 5–40)

## 2016-01-11 LAB — COMPREHENSIVE METABOLIC PANEL
A/G RATIO: 1.9 (ref 1.1–2.5)
ALT: 21 IU/L (ref 0–32)
AST: 26 IU/L (ref 0–40)
Albumin: 4.1 g/dL (ref 3.5–5.5)
Alkaline Phosphatase: 83 IU/L (ref 39–117)
BUN/Creatinine Ratio: 19 (ref 9–23)
BUN: 14 mg/dL (ref 6–24)
Bilirubin Total: 0.3 mg/dL (ref 0.0–1.2)
CALCIUM: 9.5 mg/dL (ref 8.7–10.2)
CO2: 27 mmol/L (ref 18–29)
CREATININE: 0.73 mg/dL (ref 0.57–1.00)
Chloride: 98 mmol/L (ref 96–106)
GFR, EST AFRICAN AMERICAN: 106 mL/min/{1.73_m2} (ref >59)
GFR, EST NON AFRICAN AMERICAN: 92 mL/min/{1.73_m2} (ref >59)
GLUCOSE: 85 mg/dL (ref 65–99)
Globulin, Total: 2.2 g/dL (ref 1.5–4.5)
Potassium: 4.6 mmol/L (ref 3.5–5.2)
Sodium: 140 mmol/L (ref 134–144)
TOTAL PROTEIN: 6.3 g/dL (ref 6.0–8.5)

## 2016-01-11 LAB — CBC WITH DIFFERENTIAL/PLATELET
BASOS: 1 %
Basophils Absolute: 0 10*3/uL (ref 0.0–0.2)
EOS (ABSOLUTE): 0.1 10*3/uL (ref 0.0–0.4)
Eos: 2 %
Hematocrit: 38 % (ref 34.0–46.6)
Hemoglobin: 12.6 g/dL (ref 11.1–15.9)
IMMATURE GRANS (ABS): 0 10*3/uL (ref 0.0–0.1)
IMMATURE GRANULOCYTES: 0 %
LYMPHS: 34 %
Lymphocytes Absolute: 2.1 10*3/uL (ref 0.7–3.1)
MCH: 30.6 pg (ref 26.6–33.0)
MCHC: 33.2 g/dL (ref 31.5–35.7)
MCV: 92 fL (ref 79–97)
Monocytes Absolute: 0.6 10*3/uL (ref 0.1–0.9)
Monocytes: 10 %
NEUTROS PCT: 53 %
Neutrophils Absolute: 3.3 10*3/uL (ref 1.4–7.0)
PLATELETS: 287 10*3/uL (ref 150–379)
RBC: 4.12 x10E6/uL (ref 3.77–5.28)
RDW: 13.3 % (ref 12.3–15.4)
WBC: 6.1 10*3/uL (ref 3.4–10.8)

## 2016-01-11 LAB — TSH: TSH: 1.07 u[IU]/mL (ref 0.450–4.500)

## 2016-01-11 LAB — HEPATITIS C ANTIBODY: Hep C Virus Ab: <0.1 s/co ratio (ref 0.0–0.9)

## 2016-01-14 ENCOUNTER — Encounter: Payer: Self-pay | Admitting: Family Medicine

## 2016-01-25 ENCOUNTER — Ambulatory Visit
Admission: RE | Admit: 2016-01-25 | Discharge: 2016-01-25 | Disposition: A | Discharge status: Home / Self Care | Payer: BC Managed Care – PPO | Source: Ambulatory Visit | Attending: Family Medicine | Admitting: Family Medicine

## 2016-01-25 DIAGNOSIS — Z1231 Encounter for screening mammogram for malignant neoplasm of breast: Secondary | ICD-10-CM | POA: Insufficient documentation

## 2016-01-25 DIAGNOSIS — Z1239 Encounter for other screening for malignant neoplasm of breast: Secondary | ICD-10-CM

## 2016-05-19 ENCOUNTER — Ambulatory Visit (INDEPENDENT_AMBULATORY_CARE_PROVIDER_SITE_OTHER): Payer: BC Managed Care – PPO | Admitting: Unknown Physician Specialty

## 2016-05-19 ENCOUNTER — Encounter: Payer: Self-pay | Admitting: Unknown Physician Specialty

## 2016-05-19 VITALS — BP 125/85 | HR 73 | Temp 98.5°F | Ht 62.7 in | Wt 150.0 lb

## 2016-05-19 DIAGNOSIS — J0100 Acute maxillary sinusitis, unspecified: Secondary | ICD-10-CM

## 2016-05-19 MED ORDER — AMOXICILLIN-POT CLAVULANATE 875-125 MG PO TABS: 1.0000 | ORAL_TABLET | Freq: Two times a day (BID) | ORAL | Status: DC

## 2016-05-19 MED ORDER — FLUCONAZOLE 150 MG PO TABS: 150.0000 mg | ORAL_TABLET | Freq: Once | ORAL | Status: DC

## 2016-05-19 NOTE — Progress Notes (Signed)
BP 125/85 mmHg  Pulse 73  Temp(Src) 98.5 F (36.9 C)  Ht 5' 2.7" (1.593 m)  Wt 150 lb (68.04 kg)  BMI 26.81 kg/m2  SpO2 98%  LMP 04/30/2015 (Exact Date)   Subjective:    Patient ID: Kimberly Yang, female    DOB: 1961/01/27, 55 y.o.   MRN: [id number]  HPI: Kimberly Yang is a 55 y.o. female  Chief Complaint  Patient presents with  . URI    pt states she has sinus pressure, sinus congestion, and cough. States symptoms started about a week and a half ago    URI  This is a new problem. Episode onset: 10 days. The problem has been gradually worsening. There has been no fever. Associated symptoms include congestion, coughing, sinus pain, a sore throat and swollen glands. Pertinent negatives include no ear pain or rhinorrhea. She has tried decongestant and antihistamine for the symptoms. The treatment provided no relief.    Relevant past medical, surgical, family and social history reviewed and updated as indicated. Interim medical history since our last visit reviewed. Allergies and medications reviewed and updated.  Review of Systems  HENT: Positive for congestion and sore throat. Negative for ear pain and rhinorrhea.   Respiratory: Positive for cough.     Per HPI unless specifically indicated above     Objective:    BP 125/85 mmHg  Pulse 73  Temp(Src) 98.5 F (36.9 C)  Ht 5' 2.7" (1.593 m)  Wt 150 lb (68.04 kg)  BMI 26.81 kg/m2  SpO2 98%  LMP 04/30/2015 (Exact Date)  Wt Readings from Last 3 Encounters:  05/19/16 150 lb (68.04 kg)  01/10/16 150 lb (68.04 kg)  09/28/15 159 lb (72.122 kg)    Physical Exam  Constitutional: She is oriented to person, place, and time. She appears well-developed and well-nourished. No distress.  HENT:  Head: Normocephalic and atraumatic.  Right Ear: Tympanic membrane and ear canal normal.  Left Ear: Tympanic membrane and ear canal normal.  Nose: No rhinorrhea. Right sinus exhibits maxillary sinus tenderness. Right sinus exhibits no  frontal sinus tenderness. Left sinus exhibits maxillary sinus tenderness. Left sinus exhibits no frontal sinus tenderness.  Eyes: Conjunctivae and lids are normal. Right eye exhibits no discharge. Left eye exhibits no discharge. No scleral icterus.  Cardiovascular: Normal rate and regular rhythm.   Pulmonary/Chest: Effort normal and breath sounds normal. No respiratory distress.  Abdominal: Normal appearance. There is no splenomegaly or hepatomegaly.  Musculoskeletal: Normal range of motion.  Neurological: She is alert and oriented to person, place, and time.  Skin: Skin is intact. No rash noted. No pallor.  Psychiatric: She has a normal mood and affect. Her behavior is normal. Judgment and thought content normal.    Results for orders placed or performed in visit on 01/10/16  Microscopic Examination  Result Value Ref Range   WBC, UA 0-5 0 -  5 /hpf   RBC, UA 0-2 0 -  2 /hpf   Epithelial Cells (non renal) 0-10 0 - 10 /hpf   Bacteria, UA Few None seen/Few  CBC with Differential/Platelet  Result Value Ref Range   WBC 6.1 3.4 - 10.8 x10E3/uL   RBC 4.12 3.77 - 5.28 x10E6/uL   Hemoglobin 12.6 11.1 - 15.9 g/dL   Hematocrit 78.2 95.6 - 46.6 %   MCV 92 79 - 97 fL   MCH 30.6 26.6 - 33.0 pg   MCHC 33.2 31.5 - 35.7 g/dL   RDW 21.3 08.6 - 57.8 %  Platelets 287 150 - 379 x10E3/uL   Neutrophils 53 %   Lymphs 34 %   Monocytes 10 %   Eos 2 %   Basos 1 %   Neutrophils Absolute 3.3 1.4 - 7.0 x10E3/uL   Lymphocytes Absolute 2.1 0.7 - 3.1 x10E3/uL   Monocytes Absolute 0.6 0.1 - 0.9 x10E3/uL   EOS (ABSOLUTE) 0.1 0.0 - 0.4 x10E3/uL   Basophils Absolute 0.0 0.0 - 0.2 x10E3/uL   Immature Granulocytes 0 %   Immature Grans (Abs) 0.0 0.0 - 0.1 x10E3/uL  Comprehensive metabolic panel  Result Value Ref Range   Glucose 85 65 - 99 mg/dL   BUN 14 6 - 24 mg/dL   Creatinine, Ser 0.450.73 0.57 - 1.00 mg/dL   GFR calc non Af Amer 92 >59 mL/min/1.73   GFR calc Af Amer 106 >59 mL/min/1.73   BUN/Creatinine  Ratio 19 9 - 23   Sodium 140 134 - 144 mmol/L   Potassium 4.6 3.5 - 5.2 mmol/L   Chloride 98 96 - 106 mmol/L   CO2 27 18 - 29 mmol/L   Calcium 9.5 8.7 - 10.2 mg/dL   Total Protein 6.3 6.0 - 8.5 g/dL   Albumin 4.1 3.5 - 5.5 g/dL   Globulin, Total 2.2 1.5 - 4.5 g/dL   Albumin/Globulin Ratio 1.9 1.1 - 2.5   Bilirubin Total 0.3 0.0 - 1.2 mg/dL   Alkaline Phosphatase 83 39 - 117 IU/L   AST 26 0 - 40 IU/L   ALT 21 0 - 32 IU/L  Lipid Panel w/o Chol/HDL Ratio  Result Value Ref Range   Cholesterol, Total 130 100 - 199 mg/dL   Triglycerides 57 0 - 149 mg/dL   HDL 66 >40>39 mg/dL   VLDL Cholesterol Cal 11 5 - 40 mg/dL   LDL Calculated 53 0 - 99 mg/dL  TSH  Result Value Ref Range   TSH 1.070 0.450 - 4.500 uIU/mL  Urinalysis, Routine w reflex microscopic (not at [facility])  Result Value Ref Range   Specific Gravity, UA 1.015 1.005 - 1.030   pH, UA 6.5 5.0 - 7.5   Color, UA Yellow Yellow   Appearance Ur Clear Clear   Leukocytes, UA 1+ (A) Negative   Protein, UA Negative Negative/Trace   Glucose, UA Negative Negative   Ketones, UA Negative Negative   RBC, UA Negative Negative   Bilirubin, UA Negative Negative   Urobilinogen, Ur 0.2 0.2 - 1.0 mg/dL   Nitrite, UA Negative Negative   Microscopic Examination See below:   Hepatitis C Antibody  Result Value Ref Range   Hep C Virus Ab <0.1 0.0 - 0.9 s/co ratio      Assessment & Plan:   Problem List Items Addressed This Visit    None    Visit Diagnoses    Acute maxillary sinusitis, recurrence not specified    -  Primary    Rx for Augmenting    Relevant Medications    amoxicillin-clavulanate (AUGMENTIN) 875-125 MG tablet    fluconazole (DIFLUCAN) 150 MG tablet        Follow up plan: Return if symptoms worsen or fail to improve.

## 2016-05-19 NOTE — Patient Instructions (Signed)
Sinusitis, Adult °Sinusitis is redness, soreness, and inflammation of the paranasal sinuses. Paranasal sinuses are air pockets within the bones of your face. They are located beneath your eyes, in the middle of your forehead, and above your eyes. In healthy paranasal sinuses, mucus is able to drain out, and air is able to circulate through them by way of your nose. However, when your paranasal sinuses are inflamed, mucus and air can become trapped. This can allow bacteria and other germs to grow and cause infection. °Sinusitis can develop quickly and last only a short time (acute) or continue over a long period (chronic). Sinusitis that lasts for more than 12 weeks is considered chronic. °CAUSES °Causes of sinusitis include: °· Allergies. °· Structural abnormalities, such as displacement of the cartilage that separates your nostrils (deviated septum), which can decrease the air flow through your nose and sinuses and affect sinus drainage. °· Functional abnormalities, such as when the small hairs (cilia) that line your sinuses and help remove mucus do not work properly or are not present. °SIGNS AND SYMPTOMS °Symptoms of acute and chronic sinusitis are the same. The primary symptoms are pain and pressure around the affected sinuses. Other symptoms include: °· Upper toothache. °· Earache. °· Headache. °· Bad breath. °· Decreased sense of smell and taste. °· A cough, which worsens when you are lying flat. °· Fatigue. °· Fever. °· Thick drainage from your nose, which often is green and may contain pus (purulent). °· Swelling and warmth over the affected sinuses. °DIAGNOSIS °Your health care provider will perform a physical exam. During your exam, your health care provider may perform any of the following to help determine if you have acute sinusitis or chronic sinusitis: °· Look in your nose for signs of abnormal growths in your nostrils (nasal polyps). °· Tap over the affected sinus to check for signs of  infection. °· View the inside of your sinuses using an imaging device that has a light attached (endoscope). °If your health care provider suspects that you have chronic sinusitis, one or more of the following tests may be recommended: °· Allergy tests. °· Nasal culture. A sample of mucus is taken from your nose, sent to a lab, and screened for bacteria. °· Nasal cytology. A sample of mucus is taken from your nose and examined by your health care provider to determine if your sinusitis is related to an allergy. °TREATMENT °Most cases of acute sinusitis are related to a viral infection and will resolve on their own within 10 days. Sometimes, medicines are prescribed to help relieve symptoms of both acute and chronic sinusitis. These may include pain medicines, decongestants, nasal steroid sprays, or saline sprays. °However, for sinusitis related to a bacterial infection, your health care provider will prescribe antibiotic medicines. These are medicines that will help kill the bacteria causing the infection. °Rarely, sinusitis is caused by a fungal infection. In these cases, your health care provider will prescribe antifungal medicine. °For some cases of chronic sinusitis, surgery is needed. Generally, these are cases in which sinusitis recurs more than 3 times per year, despite other treatments. °HOME CARE INSTRUCTIONS °· Drink plenty of water. Water helps thin the mucus so your sinuses can drain more easily. °· Use a humidifier. °· Inhale steam 3-4 times a day (for example, sit in the bathroom with the shower running). °· Apply a warm, moist washcloth to your face 3-4 times a day, or as directed by your health care provider. °· Use saline nasal sprays to help   moisten and clean your sinuses. °· Take medicines only as directed by your health care provider. °· If you were prescribed either an antibiotic or antifungal medicine, finish it all even if you start to feel better. °SEEK IMMEDIATE MEDICAL CARE IF: °· You have  increasing pain or severe headaches. °· You have nausea, vomiting, or drowsiness. °· You have swelling around your face. °· You have vision problems. °· You have a stiff neck. °· You have difficulty breathing. °  °This information is not intended to replace advice given to you by your health care provider. Make sure you discuss any questions you have with your health care provider. °  °Document Released: 11/17/2005 Document Revised: 12/08/2014 Document Reviewed: 12/02/2011 °Elsevier Interactive Patient Education ©2016 [employer] ° °Sinus Rinse °WHAT IS A SINUS RINSE? °A sinus rinse is a home treatment. It rinses your sinuses with a mixture of salt and water (saline solution). Sinuses are air-filled spaces in your skull behind the bones of your face and forehead. They open into your nasal cavity. °To do a sinus rinse, you will need: °· Saline solution. °· Neti pot or spray bottle. This releases the saline solution into your nose and through your sinuses. You can buy neti pots and spray bottles at: °¨ Your local pharmacy. °¨ A health food store. °¨ Online. °WHEN WOULD I DO A SINUS RINSE?  °A sinus rinse can help to clear your nasal cavity. It can clear:  °· Mucus. °· Dirt. °· Dust. °· Pollen. °You may do a sinus rinse when you have: °· A cold. °· A virus. °· Allergies. °· A sinus infection. °· A stuffy nose. °If you are considering a sinus rinse: °· Ask your child's doctor before doing a sinus rinse on your child. °· Do not do a sinus rinse if you have had: °¨ Ear or nasal surgery. °¨ An ear infection. °¨ Blocked ears. °HOW DO I DO A SINUS RINSE?  °· Wash your hands. °· Disinfect your device using the directions that came with the device. °· Dry your device. °· Use the solution that comes with your device or one that is sold separately in stores. Follow the mixing directions on the package. °· Fill your device with the amount of saline solution as stated in the device instructions. °· Stand over a sink and tilt  your head sideways over the sink. °· Place the spout of the device in your upper nostril (the one closer to the ceiling). °· Gently pour or squeeze the saline solution into the nasal cavity. The liquid should drain to the lower nostril if you are not too congested. °· Gently blow your nose. Blowing too hard may cause ear pain. °· Repeat in the other nostril. °· Clean and rinse your device with clean water. °· Air-dry your device. °ARE THERE RISKS OF A SINUS RINSE?  °Sinus rinse is normally very safe and helpful. However, there are a few risks, which include:  °· A burning feeling in the sinuses. This may happen if you do not make the saline solution as instructed. Make sure to follow all directions when making the saline solution. °· Infection from unclean water. This is rare, but possible. °· Nasal irritation. °  °This information is not intended to replace advice given to you by your health care provider. Make sure you discuss any questions you have with your health care provider. °  °Document Released: 06/14/2014 Document Reviewed: 06/14/2014 °Elsevier Interactive Patient Education ©2016 Elsevier Inc. ° °

## 2016-05-22 ENCOUNTER — Telehealth: Payer: Self-pay | Admitting: Unknown Physician Specialty

## 2016-05-22 NOTE — Telephone Encounter (Signed)
Pt called stated she is not feeling any better, stated she believes she needs a stronger antibiotic. Pharm [facility]. Thanks.

## 2016-05-22 NOTE — Telephone Encounter (Signed)
Routing to provider  

## 2016-05-23 MED ORDER — CEFDINIR 300 MG PO CAPS: 300.0000 mg | ORAL_CAPSULE | Freq: Two times a day (BID) | ORAL | Status: DC

## 2016-05-23 NOTE — Telephone Encounter (Signed)
[facility]

## 2016-05-23 NOTE — Telephone Encounter (Signed)
Called and let patient know new rx was sent in.

## 2016-07-10 ENCOUNTER — Ambulatory Visit: Payer: BC Managed Care – PPO | Admitting: Family Medicine

## 2016-10-09 ENCOUNTER — Ambulatory Visit
Admission: RE | Admit: 2016-10-09 | Discharge: 2016-10-09 | Disposition: A | Discharge status: Home / Self Care | Payer: BC Managed Care – PPO | Source: Ambulatory Visit | Attending: Family Medicine | Admitting: Family Medicine

## 2016-10-09 ENCOUNTER — Telehealth: Payer: Self-pay | Admitting: Family Medicine

## 2016-10-09 ENCOUNTER — Ambulatory Visit (INDEPENDENT_AMBULATORY_CARE_PROVIDER_SITE_OTHER): Payer: BC Managed Care – PPO | Admitting: Family Medicine

## 2016-10-09 ENCOUNTER — Encounter: Payer: Self-pay | Admitting: Family Medicine

## 2016-10-09 VITALS — BP 126/82 | HR 61 | Temp 98.5°F | Ht 63.7 in | Wt 152.2 lb

## 2016-10-09 DIAGNOSIS — M89319 Hypertrophy of bone, unspecified shoulder: Secondary | ICD-10-CM

## 2016-10-09 DIAGNOSIS — M89311 Hypertrophy of bone, right shoulder: Secondary | ICD-10-CM | POA: Insufficient documentation

## 2016-10-09 DIAGNOSIS — Z23 Encounter for immunization: Secondary | ICD-10-CM | POA: Diagnosis not present

## 2016-10-09 DIAGNOSIS — E785 Hyperlipidemia, unspecified: Secondary | ICD-10-CM | POA: Diagnosis not present

## 2016-10-09 DIAGNOSIS — G5603 Carpal tunnel syndrome, bilateral upper limbs: Secondary | ICD-10-CM

## 2016-10-09 DIAGNOSIS — I10 Essential (primary) hypertension: Secondary | ICD-10-CM

## 2016-10-09 LAB — LP+ALT+AST PICCOLO, WAIVED
ALT (SGPT) PICCOLO, WAIVED: 19 U/L (ref 10–47)
AST (SGOT) Piccolo, Waived: 31 U/L (ref 11–38)
CHOL/HDL RATIO PICCOLO,WAIVE: 2.1 mg/dL
Cholesterol Piccolo, Waived: 138 mg/dL (ref <200)
HDL Chol Piccolo, Waived: 66 mg/dL (ref >59)
LDL Chol Calc Piccolo Waived: 44 mg/dL (ref <100)
TRIGLYCERIDES PICCOLO,WAIVED: 141 mg/dL (ref <150)
VLDL CHOL CALC PICCOLO,WAIVE: 28 mg/dL (ref <30)

## 2016-10-09 NOTE — Telephone Encounter (Signed)
Phone call Discussed with patient need CT of clavicle will arrange by ordering CT of her shoulder as recommended by radiology.

## 2016-10-09 NOTE — Progress Notes (Signed)
BP 126/82 (BP Location: Left Arm, Patient Position: Sitting, Cuff Size: Normal)   Pulse 61   Temp 98.5 F (36.9 C)   Ht 5' 3.7" (1.618 m)   Wt 152 lb 3.2 oz (69 kg)   LMP 04/30/2015 (Exact Date)   SpO2 97%   BMI 26.37 kg/m    Subjective:    Patient ID: Kimberly Yang, female    DOB: [birth year], 55 y.o.   MRN: [id number]  HPI: Kimberly Yang is a 55 y.o. female  Chief Complaint  Patient presents with  . Cyst    pt states she has a knot on her right collarbone that she first noticed about 3 weeks ago  . Hyperlipidemia   Patient follow-up cholesterol and blood pressure doing well no complaints from medications taken faithfully without problems Patient though is noticed right sternoclavicular junction and area of swelling with no pain.'s been present for 3-4 weeks. No other noticed abnormality no arm weakness or changes. No known trauma or irritation.  Patient also with bilateral hand numbness comes on when waking up from sleep and also been doing her hair in the morning with curlers.  Relevant past medical, surgical, family and social history reviewed and updated as indicated. Interim medical history since our last visit reviewed. Allergies and medications reviewed and updated.  Review of Systems  Constitutional: Negative.   Respiratory: Negative.   Cardiovascular: Negative.     Per HPI unless specifically indicated above     Objective:    BP 126/82 (BP Location: Left Arm, Patient Position: Sitting, Cuff Size: Normal)   Pulse 61   Temp 98.5 F (36.9 C)   Ht 5' 3.7" (1.618 m)   Wt 152 lb 3.2 oz (69 kg)   LMP 04/30/2015 (Exact Date)   SpO2 97%   BMI 26.37 kg/m   Wt Readings from Last 3 Encounters:  10/09/16 152 lb 3.2 oz (69 kg)  05/19/16 150 lb (68 kg)  01/10/16 150 lb (68 kg)    Physical Exam  Constitutional: She is oriented to person, place, and time. She appears well-developed and well-nourished. No distress.  HENT:  Head: Normocephalic and atraumatic.    Right Ear: Hearing normal.  Left Ear: Hearing normal.  Nose: Nose normal.  Eyes: Conjunctivae and lids are normal. Right eye exhibits no discharge. Left eye exhibits no discharge. No scleral icterus.  Cardiovascular: Normal rate, regular rhythm and normal heart sounds.   Pulmonary/Chest: Effort normal and breath sounds normal. No respiratory distress.  Musculoskeletal: Normal range of motion.  Left sternoclavicular junction with clavicle enlarged nontender  Neurological: She is alert and oriented to person, place, and time.  Tinel's and Phalen's signs positive bilateral  Skin: Skin is intact. No rash noted.  Psychiatric: She has a normal mood and affect. Her speech is normal and behavior is normal. Judgment and thought content normal. Cognition and memory are normal.    Results for orders placed or performed in visit on 01/10/16  Microscopic Examination  Result Value Ref Range   WBC, UA 0-5 0 - 5 /hpf   RBC, UA 0-2 0 - 2 /hpf   Epithelial Cells (non renal) 0-10 0 - 10 /hpf   Bacteria, UA Few None seen/Few  CBC with Differential/Platelet  Result Value Ref Range   WBC 6.1 3.4 - 10.8 x10E3/uL   RBC 4.12 3.77 - 5.28 x10E6/uL   Hemoglobin 12.6 11.1 - 15.9 g/dL   Hematocrit 86.538.0 78.434.0 - 46.6 %   MCV 92 79 -  97 fL   MCH 30.6 26.6 - 33.0 pg   MCHC 33.2 31.5 - 35.7 g/dL   RDW 16.1 09.6 - 04.5 %   Platelets 287 150 - 379 x10E3/uL   Neutrophils 53 %   Lymphs 34 %   Monocytes 10 %   Eos 2 %   Basos 1 %   Neutrophils Absolute 3.3 1.4 - 7.0 x10E3/uL   Lymphocytes Absolute 2.1 0.7 - 3.1 x10E3/uL   Monocytes Absolute 0.6 0.1 - 0.9 x10E3/uL   EOS (ABSOLUTE) 0.1 0.0 - 0.4 x10E3/uL   Basophils Absolute 0.0 0.0 - 0.2 x10E3/uL   Immature Granulocytes 0 %   Immature Grans (Abs) 0.0 0.0 - 0.1 x10E3/uL  Comprehensive metabolic panel  Result Value Ref Range   Glucose 85 65 - 99 mg/dL   BUN 14 6 - 24 mg/dL   Creatinine, Ser 4.09 0.57 - 1.00 mg/dL   GFR calc non Af Amer 92 >59 mL/min/1.73    GFR calc Af Amer 106 >59 mL/min/1.73   BUN/Creatinine Ratio 19 9 - 23   Sodium 140 134 - 144 mmol/L   Potassium 4.6 3.5 - 5.2 mmol/L   Chloride 98 96 - 106 mmol/L   CO2 27 18 - 29 mmol/L   Calcium 9.5 8.7 - 10.2 mg/dL   Total Protein 6.3 6.0 - 8.5 g/dL   Albumin 4.1 3.5 - 5.5 g/dL   Globulin, Total 2.2 1.5 - 4.5 g/dL   Albumin/Globulin Ratio 1.9 1.1 - 2.5   Bilirubin Total 0.3 0.0 - 1.2 mg/dL   Alkaline Phosphatase 83 39 - 117 IU/L   AST 26 0 - 40 IU/L   ALT 21 0 - 32 IU/L  Lipid Panel w/o Chol/HDL Ratio  Result Value Ref Range   Cholesterol, Total 130 100 - 199 mg/dL   Triglycerides 57 0 - 149 mg/dL   HDL 66 >81 mg/dL   VLDL Cholesterol Cal 11 5 - 40 mg/dL   LDL Calculated 53 0 - 99 mg/dL  TSH  Result Value Ref Range   TSH 1.070 0.450 - 4.500 uIU/mL  Urinalysis, Routine w reflex microscopic (not at [facility])  Result Value Ref Range   Specific Gravity, UA 1.015 1.005 - 1.030   pH, UA 6.5 5.0 - 7.5   Color, UA Yellow Yellow   Appearance Ur Clear Clear   Leukocytes, UA 1+ (A) Negative   Protein, UA Negative Negative/Trace   Glucose, UA Negative Negative   Ketones, UA Negative Negative   RBC, UA Negative Negative   Bilirubin, UA Negative Negative   Urobilinogen, Ur 0.2 0.2 - 1.0 mg/dL   Nitrite, UA Negative Negative   Microscopic Examination See below:   Hepatitis C Antibody  Result Value Ref Range   Hep C Virus Ab <0.1 0.0 - 0.9 s/co ratio      Assessment & Plan:   Problem List Items Addressed This Visit      Cardiovascular and Mediastinum   Essential hypertension - Primary    The current medical regimen is effective;  continue present plan and medications.       Relevant Orders   Basic metabolic panel     Nervous and Auditory   Carpal tunnel syndrome on both sides    Discuss use of wrist splints observe if getting worse will need orthopedic referral.        Other   Hyperlipidemia    The current medical regimen is effective;  continue present plan and  medications.  Relevant Orders   LP+ALT+AST Piccolo, Waived   Clavicle enlargement    Will x-ray clavicle and further evaluate pending results      Relevant Orders   DG Clavicle Right    Other Visit Diagnoses    Need for influenza vaccination       Relevant Orders   Flu Vaccine QUAD 36+ mos IM (Completed)       Follow up plan: Return in about 3 months (around 01/09/2017) for Physical Exam.

## 2016-10-09 NOTE — Telephone Encounter (Signed)
Pt called stated she is returning a call. Pt stated she had x rays and was wondering if someone was calling her with results. Please call pt to follow up. Thanks.

## 2016-10-09 NOTE — Assessment & Plan Note (Signed)
Discuss use of wrist splints observe if getting worse will need orthopedic referral.

## 2016-10-09 NOTE — Assessment & Plan Note (Signed)
Will x-ray clavicle and further evaluate pending results

## 2016-10-09 NOTE — Patient Instructions (Signed)

## 2016-10-09 NOTE — Assessment & Plan Note (Signed)
The current medical regimen is effective;  continue present plan and medications.  

## 2016-10-10 LAB — BASIC METABOLIC PANEL
BUN/Creatinine Ratio: 14 (ref 9–23)
BUN: 11 mg/dL (ref 6–24)
CALCIUM: 9.1 mg/dL (ref 8.7–10.2)
CO2: 26 mmol/L (ref 18–29)
CREATININE: 0.77 mg/dL (ref 0.57–1.00)
Chloride: 99 mmol/L (ref 96–106)
GFR, EST AFRICAN AMERICAN: 99 mL/min/{1.73_m2} (ref >59)
GFR, EST NON AFRICAN AMERICAN: 86 mL/min/{1.73_m2} (ref >59)
Glucose: 86 mg/dL (ref 65–99)
Potassium: 4.4 mmol/L (ref 3.5–5.2)
Sodium: 139 mmol/L (ref 134–144)

## 2016-10-13 ENCOUNTER — Encounter: Payer: Self-pay | Admitting: Family Medicine

## 2016-10-16 ENCOUNTER — Telehealth: Payer: Self-pay | Admitting: Family Medicine

## 2016-10-16 DIAGNOSIS — Q446 Cystic disease of liver: Secondary | ICD-10-CM

## 2016-10-16 NOTE — Telephone Encounter (Signed)
Phone call [facility] and was able to leave a message appealing denial of the CT scan. They will get back to us.

## 2016-10-22 ENCOUNTER — Ambulatory Visit: Admission: RE | Admit: 2016-10-22 | Payer: BLUE CROSS/BLUE SHIELD | Source: Ambulatory Visit

## 2016-10-27 ENCOUNTER — Ambulatory Visit
Admission: RE | Admit: 2016-10-27 | Discharge: 2016-10-27 | Disposition: A | Discharge status: Home / Self Care | Payer: BC Managed Care – PPO | Source: Ambulatory Visit | Attending: Family Medicine | Admitting: Family Medicine

## 2016-10-27 DIAGNOSIS — M89311 Hypertrophy of bone, right shoulder: Secondary | ICD-10-CM | POA: Insufficient documentation

## 2016-10-27 DIAGNOSIS — M19011 Primary osteoarthritis, right shoulder: Secondary | ICD-10-CM | POA: Insufficient documentation

## 2016-10-27 DIAGNOSIS — M89319 Hypertrophy of bone, unspecified shoulder: Secondary | ICD-10-CM | POA: Diagnosis present

## 2016-10-28 NOTE — Telephone Encounter (Signed)
Phone call Discussed with patient further findings on CT showing numerous liver cysts will refer to GI to further evaluate.

## 2016-10-28 NOTE — Progress Notes (Signed)
No answer, no voicemail set up.  Will try again later.

## 2016-11-05 ENCOUNTER — Ambulatory Visit: Payer: BLUE CROSS/BLUE SHIELD | Admitting: Gastroenterology

## 2016-11-25 ENCOUNTER — Ambulatory Visit (INDEPENDENT_AMBULATORY_CARE_PROVIDER_SITE_OTHER): Payer: BC Managed Care – PPO | Admitting: Family Medicine

## 2016-11-25 ENCOUNTER — Encounter: Payer: Self-pay | Admitting: Family Medicine

## 2016-11-25 VITALS — BP 111/79 | HR 54 | Temp 98.6°F | Wt 147.0 lb

## 2016-11-25 DIAGNOSIS — J0101 Acute recurrent maxillary sinusitis: Secondary | ICD-10-CM | POA: Diagnosis not present

## 2016-11-25 MED ORDER — CEFDINIR 300 MG PO CAPS: 300.0000 mg | ORAL_CAPSULE | Freq: Two times a day (BID) | ORAL | 0 refills | Status: DC

## 2016-11-25 MED ORDER — PREDNISONE 20 MG PO TABS: 40.0000 mg | ORAL_TABLET | Freq: Every day | ORAL | 0 refills | Status: DC

## 2016-11-25 NOTE — Patient Instructions (Signed)
Follow up as needed

## 2016-11-25 NOTE — Progress Notes (Signed)
   BP 111/79   Pulse (!) 54   Temp 98.6 F (37 C)   Wt 147 lb (66.7 kg)   LMP 04/30/2015 (Exact Date)   SpO2 97%   BMI 25.47 kg/m    Subjective:    Patient ID: Kimberly Yang, female    DOB: 07/07/1961, 55 y.o.   MRN: [id number]  HPI: Kimberly Soursenny C Lutes is a 55 y.o. female  Chief Complaint  Patient presents with  . URI    x 10 days. Sinus, head congestion, face pain, headache, nasal congestion, sinus drainage. No sore throat, no ear ache, no fever.    Patient presents with 10 day history of sinus pressure and pain, tooth pain, headaches, congestion, sore throat. Taking mucinex, sudafed, and sinus rinses with no relief. Frequent hx of sinusitis. No sick contacts.   Relevant past medical, surgical, family and social history reviewed and updated as indicated. Interim medical history since our last visit reviewed. Allergies and medications reviewed and updated.  Review of Systems  Constitutional: Negative.   HENT: Positive for congestion, sinus pain, sinus pressure and sore throat.   Eyes: Negative.   Respiratory: Negative.   Cardiovascular: Negative.   Gastrointestinal: Negative.   Genitourinary: Negative.   Musculoskeletal: Negative.   Neurological: Positive for headaches.  Psychiatric/Behavioral: Negative.     Per HPI unless specifically indicated above     Objective:    BP 111/79   Pulse (!) 54   Temp 98.6 F (37 C)   Wt 147 lb (66.7 kg)   LMP 04/30/2015 (Exact Date)   SpO2 97%   BMI 25.47 kg/m   Wt Readings from Last 3 Encounters:  11/25/16 147 lb (66.7 kg)  10/09/16 152 lb 3.2 oz (69 kg)  05/19/16 150 lb (68 kg)    Physical Exam  Constitutional: She is oriented to person, place, and time. She appears well-developed and well-nourished. No distress.  HENT:  Head: Atraumatic.  Right Ear: External ear normal.  Left Ear: External ear normal.  Nose: Nose normal.  Mouth/Throat: Oropharynx is clear and moist.  B/l maxillary sinus TTP  Eyes: Conjunctivae are  normal. Pupils are equal, round, and reactive to light. No scleral icterus.  Neck: Normal range of motion. Neck supple.  Cardiovascular: Normal rate, regular rhythm and normal heart sounds.   Pulmonary/Chest: Effort normal and breath sounds normal. No respiratory distress.  Musculoskeletal: Normal range of motion.  Neurological: She is alert and oriented to person, place, and time.  Skin: Skin is warm and dry.  Psychiatric: She has a normal mood and affect. Her behavior is normal.      Assessment & Plan:   Problem List Items Addressed This Visit    None    Visit Diagnoses    Acute recurrent maxillary sinusitis    -  Primary   Pt requests cefdinir as it's worked well in the past. Will have prdnisone waiting at pharmacy in case needed. Tylenol or ibuprofen, continue mucinex.    Relevant Medications   cefdinir (OMNICEF) 300 MG capsule   predniSONE (DELTASONE) 20 MG tablet       Follow up plan: Return if symptoms worsen or fail to improve.

## 2017-01-28 ENCOUNTER — Other Ambulatory Visit: Payer: Self-pay | Admitting: Student

## 2017-01-28 DIAGNOSIS — K7689 Other specified diseases of liver: Secondary | ICD-10-CM

## 2017-02-15 ENCOUNTER — Ambulatory Visit
Admission: EM | Admit: 2017-02-15 | Discharge: 2017-02-15 | Disposition: A | Discharge status: Home / Self Care | Payer: BC Managed Care – PPO | Attending: Family Medicine | Admitting: Family Medicine

## 2017-02-15 DIAGNOSIS — J0111 Acute recurrent frontal sinusitis: Secondary | ICD-10-CM | POA: Diagnosis not present

## 2017-02-15 MED ORDER — CEFDINIR 300 MG PO CAPS: 300.0000 mg | ORAL_CAPSULE | Freq: Two times a day (BID) | ORAL | 0 refills | Status: DC

## 2017-02-15 MED ORDER — FLUCONAZOLE 150 MG PO TABS: ORAL_TABLET | ORAL | 0 refills | Status: DC

## 2017-02-15 NOTE — ED Triage Notes (Signed)
Pt with facial pain and headache pain, head congestion. Pain 4/10

## 2017-02-15 NOTE — ED Provider Notes (Signed)
CSN: [id number]     Arrival date & time 02/15/17  0935 History   First MD Initiated Contact with Patient 02/15/17 1002     Chief Complaint  Patient presents with  . Facial Pain   (Consider location/radiation/quality/duration/timing/severity/associated sxs/prior Treatment) HPI  This 55 year old female who presents with facial pain and headache that she's had for 4 days. Had endoscopic sinus surgery years ago and at first worked well but since then has had recurrent sinus infections. He states that if they are not treated early that they will come very of fulminant and hard to treat. He states that in the past [facility] has worked well.       Past Medical History:  Diagnosis Date  . Allergic rhinitis   . Headache   . Hyperlipidemia   . Sinusitis   . Vaginitis    Past Surgical History:  Procedure Laterality Date  . ABDOMINAL HYSTERECTOMY    . COLONOSCOPY    . CYSTOSCOPY N/A 05/21/2015   Procedure: CYSTOSCOPY;  Surgeon: Christeen Douglas, MD;  Location: [facility];  Service: Gynecology;  Laterality: N/A;  . HYSTEROSCOPY W/D&C N/A 04/20/2015   Procedure: DILATATION AND CURETTAGE /HYSTEROSCOPY;  Surgeon: Christeen Douglas, MD;  Location: [facility];  Service: Gynecology;  Laterality: N/A;  . LAPAROSCOPIC ASSISTED VAGINAL HYSTERECTOMY N/A 05/21/2015   Procedure: LAPAROSCOPIC ASSISTED VAGINAL HYSTERECTOMY;  Surgeon: Christeen Douglas, MD;  Location: [facility];  Service: Gynecology;  Laterality: N/A;  . LAPAROSCOPIC SALPINGO OOPHERECTOMY Bilateral 05/21/2015   Procedure: LAPAROSCOPIC SALPINGO OOPHORECTOMY;  Surgeon: Christeen Douglas, MD;  Location: [facility];  Service: Gynecology;  Laterality: Bilateral;  . NASAL SINUS SURGERY     History reviewed. No pertinent family history. Social History  Substance Use Topics  . Smoking status: Never Smoker  . Smokeless tobacco: Never Used  . Alcohol use No   OB History    No data available     Review of Systems  Constitutional: Positive for activity  change. Negative for chills, fatigue and fever.  HENT: Positive for congestion.   All other systems reviewed and are negative.   Allergies  Patient has no known allergies.  Home Medications   Prior to Admission medications   Medication Sig Start Date End Date Taking? Authorizing Provider  aspirin EC 81 MG tablet Take 81 mg by mouth daily.    Historical Provider, MD  atorvastatin (LIPITOR) 20 MG tablet Take 1 tablet (20 mg total) by mouth at bedtime. 01/10/16   Steele Sizer, MD  bisoprolol-hydrochlorothiazide (ZIAC) 5-6.25 MG tablet Take 1 tablet by mouth daily. 01/10/16   Steele Sizer, MD  cefdinir (OMNICEF) 300 MG capsule Take 1 capsule (300 mg total) by mouth 2 (two) times daily. 02/15/17   Lutricia Feil, PA-C  cholecalciferol (VITAMIN D) 1000 UNITS tablet Take 1,000 Units by mouth daily.    Historical Provider, MD  fexofenadine-pseudoephedrine (ALLEGRA-D ALLERGY & CONGESTION) 180-240 MG 24 hr tablet Take 1 tablet by mouth daily. Patient taking differently: Take 1 tablet by mouth daily as needed.  09/04/15   Hassan Rowan, MD  fluconazole (DIFLUCAN) 150 MG tablet Take one tab for symptoms of yeast infection. Repeat x 1 in 72 hours. 02/15/17   Lutricia Feil, PA-C   Meds Ordered and Administered this Visit  Medications - No data to display  BP 114/70 (BP Location: Left Arm)   Pulse 71   Temp 98.2 F (36.8 C) (Oral)   Resp 18   Ht 5\' 2"  (1.575 m)   Wt 150  lb (68 kg)   LMP 04/30/2015 (Exact Date)   SpO2 99%   BMI 27.44 kg/m  No data found.   Physical Exam  Constitutional: She is oriented to person, place, and time. She appears well-developed and well-nourished. No distress.  HENT:  Head: Normocephalic and atraumatic.  Right Ear: External ear normal.  Left Ear: External ear normal.  Nose: Nose normal.  Mouth/Throat: Oropharynx is clear and moist. No oropharyngeal exudate.  Patientt has tenderness to percussion over the maxillary sinuses greater than frontal.  Eyes: EOM  are normal. Pupils are equal, round, and reactive to light. Right eye exhibits no discharge. Left eye exhibits no discharge.  Neck: Normal range of motion.  Pulmonary/Chest: Effort normal and breath sounds normal. No respiratory distress. She has no wheezes. She has no rales.  Musculoskeletal: Normal range of motion.  Neurological: She is alert and oriented to person, place, and time.  Skin: Skin is warm and dry. She is not diaphoretic.  Psychiatric: She has a normal mood and affect. Her behavior is normal. Judgment and thought content normal.  Nursing note and vitals reviewed.   Urgent Care Course     Procedures (including critical care time)  Labs Review Labs Reviewed - No data to display  Imaging Review No results found.   Visual Acuity Review  Right Eye Distance:   Left Eye Distance:   Bilateral Distance:    Right Eye Near:   Left Eye Near:    Bilateral Near:         MDM   1. Acute recurrent frontal sinusitis    Discharge Medication List as of 02/15/2017 10:14 AM    Plan: 1. Test/x-ray results and diagnosis reviewed with patient 2. rx as per orders; risks, benefits, potential side effects reviewed with patient 3. Recommend supportive treatment with Casilda CarlsNettie Pot use and Flonase. Give her prescription for Omnicef which she states has helped her the most of the past. So recommended that because of her recurrent sinus problems that she should consider visiting Dr. Genevive BiJuengle evaluation for possible sinus ballooning. 4. F/u prn if symptoms worsen or don't improve     Lutricia FeilWilliam P Kendrew Paci, PA-C 02/15/17 1035

## 2017-03-02 ENCOUNTER — Ambulatory Visit
Admission: RE | Admit: 2017-03-02 | Discharge: 2017-03-02 | Disposition: A | Discharge status: Home / Self Care | Payer: BC Managed Care – PPO | Source: Ambulatory Visit | Attending: Student | Admitting: Student

## 2017-03-02 DIAGNOSIS — K7689 Other specified diseases of liver: Secondary | ICD-10-CM | POA: Insufficient documentation

## 2017-04-17 ENCOUNTER — Encounter: Payer: Self-pay | Admitting: Family Medicine

## 2017-04-17 ENCOUNTER — Ambulatory Visit (INDEPENDENT_AMBULATORY_CARE_PROVIDER_SITE_OTHER): Payer: BC Managed Care – PPO | Admitting: Family Medicine

## 2017-04-17 VITALS — BP 109/73 | HR 60 | Temp 97.9°F | Ht 62.0 in | Wt 152.9 lb

## 2017-04-17 DIAGNOSIS — E785 Hyperlipidemia, unspecified: Secondary | ICD-10-CM

## 2017-04-17 DIAGNOSIS — I10 Essential (primary) hypertension: Secondary | ICD-10-CM | POA: Diagnosis not present

## 2017-04-17 DIAGNOSIS — Z1231 Encounter for screening mammogram for malignant neoplasm of breast: Secondary | ICD-10-CM | POA: Diagnosis not present

## 2017-04-17 DIAGNOSIS — Z0000 Encounter for general adult medical examination without abnormal findings: Secondary | ICD-10-CM

## 2017-04-17 DIAGNOSIS — Z1239 Encounter for other screening for malignant neoplasm of breast: Secondary | ICD-10-CM

## 2017-04-17 LAB — UA/M W/RFLX CULTURE, ROUTINE
BILIRUBIN UA: NEGATIVE
GLUCOSE, UA: NEGATIVE
KETONES UA: NEGATIVE
LEUKOCYTES UA: NEGATIVE
Nitrite, UA: NEGATIVE
PROTEIN UA: NEGATIVE
RBC UA: NEGATIVE
UUROB: 0.2 mg/dL (ref 0.2–1.0)
pH, UA: 7 (ref 5.0–7.5)

## 2017-04-17 LAB — MICROSCOPIC EXAMINATION
Bacteria, UA: NONE SEEN
RBC, UA: NONE SEEN /hpf (ref 0–?)
WBC UA: NONE SEEN /HPF (ref 0–?)

## 2017-04-17 MED ORDER — ATORVASTATIN CALCIUM 20 MG PO TABS: 20.0000 mg | ORAL_TABLET | Freq: Every day | ORAL | 4 refills | Status: DC

## 2017-04-17 MED ORDER — BISOPROLOL-HYDROCHLOROTHIAZIDE 5-6.25 MG PO TABS: 1.0000 | ORAL_TABLET | Freq: Every day | ORAL | 4 refills | Status: DC

## 2017-04-17 NOTE — Progress Notes (Signed)
BP 109/73 (BP Location: Left Arm, Patient Position: Sitting, Cuff Size: Normal)   Pulse 60   Temp 97.9 F (36.6 C)   Ht 5\' 2"  (1.575 m)   Wt 152 lb 14.4 oz (69.4 kg)   LMP 04/30/2015 (Exact Date)   SpO2 95%   BMI 27.97 kg/m    Subjective:    Patient ID: Kimberly Yang, female    DOB: [birth year], 56 y.o.   MRN: [id number]  HPI: EVOLEHT HOVATTER is a 56 y.o. female presenting on 04/17/2017 for comprehensive medical examination. Current medical complaints include:none  Not fasting today for lab work.   She currently lives with: Menopausal Symptoms: no  Depression Screen done today and results listed below:  Depression screen [facility] 2/9 04/17/2017  Decreased Interest 0  Down, Depressed, Hopeless 0  PHQ - 2 Score 0  Altered sleeping 0  Tired, decreased energy 0  Change in appetite 0  Feeling bad or failure about yourself  0  Trouble concentrating 0  Moving slowly or fidgety/restless 0  Suicidal thoughts 0  PHQ-9 Score 0    The patient does not have a history of falls. I did not complete a risk assessment for falls. A plan of care for falls was not documented.   Past Medical History:  Past Medical History:  Diagnosis Date  . Allergic rhinitis   . Headache   . Hyperlipidemia   . Sinusitis   . Vaginitis     Surgical History:  Past Surgical History:  Procedure Laterality Date  . ABDOMINAL HYSTERECTOMY    . COLONOSCOPY    . CYSTOSCOPY N/A 05/21/2015   Procedure: CYSTOSCOPY;  Surgeon: Christeen Douglas, MD;  Location: [facility];  Service: Gynecology;  Laterality: N/A;  . HYSTEROSCOPY W/D&C N/A 04/20/2015   Procedure: DILATATION AND CURETTAGE /HYSTEROSCOPY;  Surgeon: Christeen Douglas, MD;  Location: [facility];  Service: Gynecology;  Laterality: N/A;  . LAPAROSCOPIC ASSISTED VAGINAL HYSTERECTOMY N/A 05/21/2015   Procedure: LAPAROSCOPIC ASSISTED VAGINAL HYSTERECTOMY;  Surgeon: Christeen Douglas, MD;  Location: [facility];  Service: Gynecology;  Laterality: N/A;  . LAPAROSCOPIC SALPINGO  OOPHERECTOMY Bilateral 05/21/2015   Procedure: LAPAROSCOPIC SALPINGO OOPHORECTOMY;  Surgeon: Christeen Douglas, MD;  Location: [facility];  Service: Gynecology;  Laterality: Bilateral;  . NASAL SINUS SURGERY      Medications:  Current Outpatient Prescriptions on File Prior to Visit  Medication Sig  . aspirin EC 81 MG tablet Take 81 mg by mouth daily.  . cholecalciferol (VITAMIN D) 1000 UNITS tablet Take 1,000 Units by mouth daily.  . fexofenadine-pseudoephedrine (ALLEGRA-D ALLERGY & CONGESTION) 180-240 MG 24 hr tablet Take 1 tablet by mouth daily. (Patient taking differently: Take 1 tablet by mouth daily as needed. )   No current facility-administered medications on file prior to visit.     Allergies:  No Known Allergies  Social History:  Social History   Social History  . Marital status: Divorced    Spouse name: N/A  . Number of children: N/A  . Years of education: N/A   Occupational History  . Not on file.   Social History Main Topics  . Smoking status: Never Smoker  . Smokeless tobacco: Never Used  . Alcohol use No  . Drug use: No  . Sexual activity: Yes    Birth control/ protection: Pill   Other Topics Concern  . Not on file   Social History Narrative  . No narrative on file   History  Smoking Status  . Never Smoker  Smokeless  Tobacco  . Never Used   History  Alcohol Use No    Family History:  No family history on file.  Past medical history, surgical history, medications, allergies, family history and social history reviewed with patient today and changes made to appropriate areas of the chart.   Review of Systems - General ROS: negative Psychological ROS: negative Ophthalmic ROS: negative ENT ROS: negative Allergy and Immunology ROS: negative Endocrine ROS: negative Breast ROS: negative for breast lumps Respiratory ROS: no cough, shortness of breath, or wheezing Cardiovascular ROS: no chest pain or dyspnea on exertion Gastrointestinal ROS: no  abdominal pain, change in bowel habits, or black or bloody stools Genito-Urinary ROS: no dysuria, trouble voiding, or hematuria Musculoskeletal ROS: negative Neurological ROS: no TIA or stroke symptoms Dermatological ROS: negative All other ROS negative except what is listed above and in the HPI.      Objective:    BP 109/73 (BP Location: Left Arm, Patient Position: Sitting, Cuff Size: Normal)   Pulse 60   Temp 97.9 F (36.6 C)   Ht 5\' 2"  (1.575 m)   Wt 152 lb 14.4 oz (69.4 kg)   LMP 04/30/2015 (Exact Date)   SpO2 95%   BMI 27.97 kg/m   Wt Readings from Last 3 Encounters:  04/17/17 152 lb 14.4 oz (69.4 kg)  02/15/17 150 lb (68 kg)  11/25/16 147 lb (66.7 kg)    Physical Exam  Constitutional: She is oriented to person, place, and time. She appears well-developed and well-nourished. No distress.  HENT:  Head: Atraumatic.  Right Ear: External ear normal.  Left Ear: External ear normal.  Nose: Nose normal.  Mouth/Throat: Oropharynx is clear and moist. No oropharyngeal exudate.  Eyes: Conjunctivae and EOM are normal. Pupils are equal, round, and reactive to light. No scleral icterus.  Neck: Normal range of motion. Neck supple. No thyromegaly present.  Cardiovascular: Normal rate, regular rhythm and normal heart sounds.   Pulmonary/Chest: Effort normal and breath sounds normal. No respiratory distress. Right breast exhibits no mass, no skin change and no tenderness. Left breast exhibits no mass, no skin change and no tenderness.  Abdominal: Soft. Bowel sounds are normal. She exhibits no distension. There is no tenderness. There is no rebound.  Genitourinary:  Genitourinary Comments: Deferred with shared decision making  Musculoskeletal: Normal range of motion.  Lymphadenopathy:    She has no cervical adenopathy.    She has no axillary adenopathy.  Neurological: She is alert and oriented to person, place, and time.  Skin: Skin is warm and dry. No rash noted.  Psychiatric: She  has a normal mood and affect. Her behavior is normal. Judgment and thought content normal.  Nursing note and vitals reviewed.     Assessment & Plan:   Problem List Items Addressed This Visit      Cardiovascular and Mediastinum   Essential hypertension    Stable, continue current regimen      Relevant Medications   bisoprolol-hydrochlorothiazide (ZIAC) 5-6.25 MG tablet   atorvastatin (LIPITOR) 20 MG tablet   Other Relevant Orders   UA/M w/rflx Culture, Routine (Completed)     Other   Hyperlipidemia    Stable, continue current regimen      Relevant Medications   bisoprolol-hydrochlorothiazide (ZIAC) 5-6.25 MG tablet   atorvastatin (LIPITOR) 20 MG tablet   Other Relevant Orders   Lipid Panel w/o Chol/HDL Ratio (Completed)    Other Visit Diagnoses    Annual physical exam    -  Primary  Relevant Orders   CBC with Differential/Platelet (Completed)   Comprehensive metabolic panel (Completed)   TSH (Completed)   HgB A1c (Completed)   Screening for breast cancer       Mammogram ordered   Relevant Orders   MM DIGITAL SCREENING BILATERAL       Follow up plan: Return in about 1 year (around 04/17/2018) for CPE.   LABORATORY TESTING:  - Pap smear: not applicable  IMMUNIZATIONS:   - Tdap: Tetanus vaccination status reviewed: last tetanus booster within 10 years. - Influenza: Postponed to flu season  SCREENING: -Mammogram: Ordered today  - Colonoscopy: Up to date   PATIENT COUNSELING:   Advised to take 1 mg of folate supplement per day if capable of pregnancy.   Sexuality: Discussed sexually transmitted diseases, partner selection, use of condoms, avoidance of unintended pregnancy  and contraceptive alternatives.   Advised to avoid cigarette smoking.  I discussed with the patient that most people either abstain from alcohol or drink within safe limits (<=14/week and <=4 drinks/occasion for males, <=7/weeks and <= 3 drinks/occasion for females) and that the risk for  alcohol disorders and other health effects rises proportionally with the number of drinks per week and how often a drinker exceeds daily limits.  Discussed cessation/primary prevention of drug use and availability of treatment for abuse.   Diet: Encouraged to adjust caloric intake to maintain  or achieve ideal body weight, to reduce intake of dietary saturated fat and total fat, to limit sodium intake by avoiding high sodium foods and not adding table salt, and to maintain adequate dietary potassium and calcium preferably from fresh fruits, vegetables, and low-fat dairy products.    stressed the importance of regular exercise  Injury prevention: Discussed safety belts, safety helmets, smoke detector, smoking near bedding or upholstery.   Dental health: Discussed importance of regular tooth brushing, flossing, and dental visits.    NEXT PREVENTATIVE PHYSICAL DUE IN 1 YEAR. Return in about 1 year (around 04/17/2018) for CPE.

## 2017-04-18 LAB — COMPREHENSIVE METABOLIC PANEL
ALBUMIN: 4.3 g/dL (ref 3.5–5.5)
ALK PHOS: 91 IU/L (ref 39–117)
ALT: 27 IU/L (ref 0–32)
AST: 29 IU/L (ref 0–40)
Albumin/Globulin Ratio: 2 (ref 1.2–2.2)
BUN / CREAT RATIO: 18 (ref 9–23)
BUN: 14 mg/dL (ref 6–24)
Bilirubin Total: 0.3 mg/dL (ref 0.0–1.2)
CALCIUM: 8.9 mg/dL (ref 8.7–10.2)
CO2: 25 mmol/L (ref 18–29)
CREATININE: 0.77 mg/dL (ref 0.57–1.00)
Chloride: 101 mmol/L (ref 96–106)
GFR, EST AFRICAN AMERICAN: 98 mL/min/{1.73_m2} (ref >59)
GFR, EST NON AFRICAN AMERICAN: 85 mL/min/{1.73_m2} (ref >59)
GLOBULIN, TOTAL: 2.2 g/dL (ref 1.5–4.5)
Glucose: 64 mg/dL — ABNORMAL LOW (ref 65–99)
Potassium: 4.1 mmol/L (ref 3.5–5.2)
SODIUM: 143 mmol/L (ref 134–144)
TOTAL PROTEIN: 6.5 g/dL (ref 6.0–8.5)

## 2017-04-18 LAB — CBC WITH DIFFERENTIAL/PLATELET
Basophils Absolute: 0.1 10*3/uL (ref 0.0–0.2)
Basos: 1 %
EOS (ABSOLUTE): 0.2 10*3/uL (ref 0.0–0.4)
EOS: 3 %
HEMATOCRIT: 41.4 % (ref 34.0–46.6)
HEMOGLOBIN: 13.2 g/dL (ref 11.1–15.9)
IMMATURE GRANS (ABS): 0 10*3/uL (ref 0.0–0.1)
Immature Granulocytes: 0 %
LYMPHS ABS: 2.4 10*3/uL (ref 0.7–3.1)
LYMPHS: 39 %
MCH: 30.5 pg (ref 26.6–33.0)
MCHC: 31.9 g/dL (ref 31.5–35.7)
MCV: 96 fL (ref 79–97)
MONOCYTES: 8 %
Monocytes Absolute: 0.5 10*3/uL (ref 0.1–0.9)
NEUTROS ABS: 3 10*3/uL (ref 1.4–7.0)
Neutrophils: 49 %
Platelets: 263 10*3/uL (ref 150–379)
RBC: 4.33 x10E6/uL (ref 3.77–5.28)
RDW: 13.2 % (ref 12.3–15.4)
WBC: 6.2 10*3/uL (ref 3.4–10.8)

## 2017-04-18 LAB — HEMOGLOBIN A1C
ESTIMATED AVERAGE GLUCOSE: 111 mg/dL
HEMOGLOBIN A1C: 5.5 % (ref 4.8–5.6)

## 2017-04-18 LAB — TSH: TSH: 1.47 u[IU]/mL (ref 0.450–4.500)

## 2017-04-18 LAB — LIPID PANEL W/O CHOL/HDL RATIO
Cholesterol, Total: 150 mg/dL (ref 100–199)
HDL: 62 mg/dL (ref >39)
LDL CALC: 68 mg/dL (ref 0–99)
Triglycerides: 99 mg/dL (ref 0–149)
VLDL CHOLESTEROL CAL: 20 mg/dL (ref 5–40)

## 2017-04-23 NOTE — Assessment & Plan Note (Signed)
Stable, continue current regimen 

## 2017-05-19 ENCOUNTER — Ambulatory Visit
Admission: RE | Admit: 2017-05-19 | Discharge: 2017-05-19 | Disposition: A | Discharge status: Home / Self Care | Payer: BC Managed Care – PPO | Source: Ambulatory Visit | Attending: Family Medicine | Admitting: Family Medicine

## 2017-05-19 DIAGNOSIS — Z1239 Encounter for other screening for malignant neoplasm of breast: Secondary | ICD-10-CM

## 2017-05-19 DIAGNOSIS — Z1231 Encounter for screening mammogram for malignant neoplasm of breast: Secondary | ICD-10-CM | POA: Insufficient documentation

## 2017-08-29 ENCOUNTER — Ambulatory Visit
Admission: EM | Admit: 2017-08-29 | Discharge: 2017-08-29 | Disposition: A | Discharge status: Home / Self Care | Payer: BC Managed Care – PPO | Attending: Family Medicine | Admitting: Family Medicine

## 2017-08-29 DIAGNOSIS — J019 Acute sinusitis, unspecified: Secondary | ICD-10-CM

## 2017-08-29 MED ORDER — AZITHROMYCIN 250 MG PO TABS: 250.0000 mg | ORAL_TABLET | Freq: Every day | ORAL | 0 refills | Status: DC

## 2017-08-29 MED ORDER — FLUCONAZOLE 200 MG PO TABS: 200.0000 mg | ORAL_TABLET | Freq: Once | ORAL | 1 refills | Status: AC

## 2017-08-29 NOTE — Discharge Instructions (Signed)
-  azithromycin: Take 2 tablets by mouth the first day followed by one tablet daily for next 4 days. -sinus rinse instructions as attached to help clear passages and help ears drain -can also use OTC Flonase or Afrin to help maintain open passages.

## 2017-08-29 NOTE — ED Triage Notes (Signed)
Patient complains of cough, congestion, sinus pain and pressure, headaches that started one week ago. Patient states that she has been taking sudafed.

## 2017-08-29 NOTE — ED Provider Notes (Signed)
[facility]    CSN: [id number] Arrival date & time: 08/29/17  0935     History   Chief Complaint Chief Complaint  Patient presents with  . Facial Pain    HPI Kimberly Yang is a 56 y.o. female.   Patient is a 56 year old female who presents with complaint of sinus pain and pressure 1 week. She reports that she gets about 2-3 sinus infections per year. Patient reports she's been taking Sudafed this week without much improvement. Patient reports some cough and feels sinus drainage to the back of her throat. She denies fever, chills, chest pain, shortness of breath, abdominal pain, and no nausea or vomiting. Patient also denies any fullness or discomfort in her ears and denies any sore throat.      Past Medical History:  Diagnosis Date  . Allergic rhinitis   . Headache   . Hyperlipidemia   . Sinusitis   . Vaginitis     Patient Active Problem List   Diagnosis Date Noted  . 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 Carpal tunnel syndrome on both sides 10/09/2016  . Essential hypertension 01/10/2016  . Hyperlipidemia 01/10/2016  . Postoperative state 05/21/2015    Past Surgical History:  Procedure Laterality Date  . ABDOMINAL HYSTERECTOMY    . COLONOSCOPY    . CYSTOSCOPY N/A 05/21/2015   Procedure: CYSTOSCOPY;  Surgeon: Christeen Douglas, MD;  Location: [facility];  Service: Gynecology;  Laterality: N/A;  . HYSTEROSCOPY W/D&C N/A 04/20/2015   Procedure: DILATATION AND CURETTAGE /HYSTEROSCOPY;  Surgeon: Christeen Douglas, MD;  Location: [facility];  Service: Gynecology;  Laterality: N/A;  . LAPAROSCOPIC ASSISTED VAGINAL HYSTERECTOMY N/A 05/21/2015   Procedure: LAPAROSCOPIC ASSISTED VAGINAL HYSTERECTOMY;  Surgeon: Christeen Douglas, MD;  Location: [facility];  Service: Gynecology;  Laterality: N/A;  . LAPAROSCOPIC SALPINGO OOPHERECTOMY Bilateral 05/21/2015   Procedure: LAPAROSCOPIC SALPINGO OOPHORECTOMY;  Surgeon: Christeen Douglas, MD;  Location: [facility];  Service: Gynecology;   Laterality: Bilateral;  . NASAL SINUS SURGERY      OB History    No data available       Home Medications    Prior to Admission medications   Medication Sig Start Date End Date Taking? Authorizing Provider  aspirin EC 81 MG tablet Take 81 mg by mouth daily.   Yes [provider]  atorvastatin (LIPITOR) 20 MG tablet Take 1 tablet (20 mg total) by mouth at bedtime. 04/17/17  Yes Particia Nearing, PA-C  bisoprolol-hydrochlorothiazide [facility] 5-6.25 MG tablet Take 1 tablet by mouth daily. 04/17/17  Yes Particia Nearing, PA-C  cholecalciferol (VITAMIN D) 1000 UNITS tablet Take 1,000 Units by mouth daily.   Yes [provider]  azithromycin (ZITHROMAX Z-PAK) 250 MG tablet Take 1 tablet (250 mg total) by mouth daily. Take 2 tablets by mouth the first day followed by one tablet daily for next 4 days. 08/29/17   Candis Schatz, PA-C  fexofenadine-pseudoephedrine (ALLEGRA-D ALLERGY & CONGESTION) 180-240 MG 24 hr tablet Take 1 tablet by mouth daily. Patient taking differently: Take 1 tablet by mouth daily as needed.  09/04/15   Hassan Rowan, MD    Family History History reviewed. No pertinent family history.  Social History Social History  Substance Use Topics  . Smoking status: Never Smoker  . Smokeless tobacco: Never Used  . Alcohol use No     Allergies   Patient has no known allergies.   Review of Systems Review of Systems  As noted above in history of present illness. Other  systems reviewed and found to be negative.   Physical Exam Triage Vital Signs ED Triage Vitals  Enc Vitals Group     BP 08/29/17 1006 119/82     Pulse Rate 08/29/17 1006 64     Resp 08/29/17 1006 18     Temp 08/29/17 1006 98.8 F (37.1 C)     Temp Source 08/29/17 1006 Oral     SpO2 08/29/17 1006 97 %     Weight 08/29/17 1004 150 lb (68 kg)     Height 08/29/17 1004  (1.575 m)     Head Circumference --      Peak Flow --      Pain Score 08/29/17 1004 6     Pain Loc  --      Pain Edu? --      Excl. in GC? --    No data found.   Updated Vital Signs BP 119/82 (BP Location: Right Arm)   Pulse 64   Temp 98.8 F (37.1 C) (Oral)   Resp 18   Ht  (1.575 m)   Wt 150 lb (68 kg)   LMP 04/30/2015 (Exact Date)   SpO2 97%   BMI 27.44 kg/m   Visual Acuity Right Eye Distance:   Left Eye Distance:   Bilateral Distance:    Right Eye Near:   Left Eye Near:    Bilateral Near:     Physical Exam  Constitutional: She is oriented to person, place, and time. She appears well-developed and well-nourished. No distress.  HENT:  Head: Normocephalic and atraumatic.  Right Ear: Ear canal normal. Tympanic membrane is not injected. A middle ear effusion is present.  Left Ear: Ear canal normal. Tympanic membrane is not injected. A middle ear effusion is present.  Nose: No rhinorrhea. Right sinus exhibits maxillary sinus tenderness and frontal sinus tenderness. Left sinus exhibits maxillary sinus tenderness and frontal sinus tenderness.  Mouth/Throat: Uvula is midline.  Some postnasal drainage.  Eyes: EOM are normal.  Neck: Normal range of motion. Neck supple.  Cardiovascular: Normal rate, regular rhythm and normal heart sounds.  Exam reveals no friction rub.   No murmur heard. Pulmonary/Chest: Effort normal and breath sounds normal. No respiratory distress. She has no wheezes.  Musculoskeletal: Normal range of motion.  Lymphadenopathy:    She has no cervical adenopathy.  Neurological: She is alert and oriented to person, place, and time. No cranial nerve deficit.  Skin: Skin is warm and dry.     UC Treatments / Results  Labs (all labs ordered are listed, but only abnormal results are displayed) Labs Reviewed - No data to display  EKG  EKG Interpretation None       Radiology No results found.  Procedures Procedures (including critical care time)  Medications Ordered in UC Medications - No data to display   Initial Impression /  Assessment and Plan / UC Course  I have reviewed the triage vital signs and the nursing notes.  Pertinent labs & imaging results that were available during my care of the patient were reviewed by me and considered in my medical decision making (see chart for details).    Patient presents with complaints of sinus pain and tenderness for 1 week. Patient reports she's occur 2-3 times a year.. Patient with tenderness to the maxillary and frontal sinuses. We'll go ahead and give her a prescription for azithromycin. I have patient follow-up with her primary care provider should she have no improvement.  Final Clinical  Impressions(s) / UC Diagnoses   Final diagnoses:  Acute sinusitis, recurrence not specified, unspecified location    New Prescriptions New Prescriptions   AZITHROMYCIN (ZITHROMAX Z-PAK) 250 MG TABLET    Take 1 tablet (250 mg total) by mouth daily. Take 2 tablets by mouth the first day followed by one tablet daily for next 4 days.     Controlled Substance Prescriptions [place] Controlled Substance Registry consulted? Not Applicable   Candis Schatz, PA-C    Candis Schatz, PA-C 08/29/17 1052

## 2017-09-02 ENCOUNTER — Other Ambulatory Visit: Payer: Self-pay | Admitting: Student

## 2017-09-02 DIAGNOSIS — K7689 Other specified diseases of liver: Secondary | ICD-10-CM

## 2017-10-06 ENCOUNTER — Ambulatory Visit
Admission: RE | Admit: 2017-10-06 | Discharge: 2017-10-06 | Disposition: A | Discharge status: Home / Self Care | Payer: BC Managed Care – PPO | Source: Ambulatory Visit | Attending: Student | Admitting: Student

## 2017-10-06 DIAGNOSIS — K7689 Other specified diseases of liver: Secondary | ICD-10-CM | POA: Insufficient documentation

## 2017-12-28 ENCOUNTER — Ambulatory Visit: Payer: BC Managed Care – PPO | Admitting: Family Medicine

## 2017-12-28 ENCOUNTER — Encounter: Payer: Self-pay | Admitting: Family Medicine

## 2017-12-28 VITALS — BP 122/81 | HR 68 | Temp 98.6°F | Wt 158.0 lb

## 2017-12-28 DIAGNOSIS — J019 Acute sinusitis, unspecified: Secondary | ICD-10-CM

## 2017-12-28 MED ORDER — FLUCONAZOLE 150 MG PO TABS: 150.0000 mg | ORAL_TABLET | Freq: Once | ORAL | 2 refills | Status: AC

## 2017-12-28 MED ORDER — AMOXICILLIN-POT CLAVULANATE 875-125 MG PO TABS: 1.0000 | ORAL_TABLET | Freq: Two times a day (BID) | ORAL | 0 refills | Status: DC

## 2017-12-28 NOTE — Progress Notes (Signed)
BP 122/81   Pulse 68   Temp 98.6 F (37 C) (Oral)   Wt 158 lb (71.7 kg)   LMP 04/30/2015 (Exact Date)   SpO2 98%   BMI 28.90 kg/m    Subjective:    Patient ID: Kimberly Yang, female    DOB: [birth year], 59 y.o.   MRN: [id number]  HPI: Kimberly Soursenny C Alomar is a 57 y.o. female  Chief Complaint  Patient presents with  . Sinusitis    x 2 weeks.   Patient with 2 weeks of upper respiratory infection getting worse with marked sinus pressure achiness systemic symptoms of feeling bad fever chills. Has taken Mucinex Sudafed Tylenol nasal rinse with only minimal effects just getting worse.  Splitting frontal headache especially worse with bending.  Left ear is started to get congested also with some popping.  Relevant past medical, surgical, family and social history reviewed and updated as indicated. Interim medical history since our last visit reviewed. Allergies and medications reviewed and updated.  Review of Systems  Constitutional: Positive for chills, diaphoresis and fatigue.  HENT: Positive for congestion, ear pain, rhinorrhea, sinus pressure, sinus pain and sore throat.   Respiratory: Negative.   Cardiovascular: Negative.     Per HPI unless specifically indicated above     Objective:    BP 122/81   Pulse 68   Temp 98.6 F (37 C) (Oral)   Wt 158 lb (71.7 kg)   LMP 04/30/2015 (Exact Date)   SpO2 98%   BMI 28.90 kg/m   Wt Readings from Last 3 Encounters:  12/28/17 158 lb (71.7 kg)  08/29/17 150 lb (68 kg)  04/17/17 152 lb 14.4 oz (69.4 kg)    Physical Exam  Constitutional: She is oriented to person, place, and time. She appears well-developed and well-nourished.  HENT:  Head: Normocephalic and atraumatic.  Right Ear: External ear normal.  Mouth/Throat: Oropharyngeal exudate present.  Left ear with mild inflammation of the tympanic membrane  Eyes: Conjunctivae and EOM are normal.  Neck: Normal range of motion.  Cardiovascular: Normal rate, regular rhythm and normal  heart sounds.  Pulmonary/Chest: Effort normal and breath sounds normal.  Musculoskeletal: Normal range of motion.  Neurological: She is alert and oriented to person, place, and time.  Skin: No erythema.  Psychiatric: She has a normal mood and affect. Her behavior is normal. Judgment and thought content normal.    Results for orders placed or performed in visit on 04/17/17  Microscopic Examination  Result Value Ref Range   WBC, UA None seen 0 - 5 /hpf   RBC, UA None seen 0 - 2 /hpf   Epithelial Cells (non renal) CANCELED    Bacteria, UA None seen None seen/Few  CBC with Differential/Platelet  Result Value Ref Range   WBC 6.2 3.4 - 10.8 x10E3/uL   RBC 4.33 3.77 - 5.28 x10E6/uL   Hemoglobin 13.2 11.1 - 15.9 g/dL   Hematocrit 29.541.4 28.434.0 - 46.6 %   MCV 96 79 - 97 fL   MCH 30.5 26.6 - 33.0 pg   MCHC 31.9 31.5 - 35.7 g/dL   RDW 13.213.2 44.012.3 - 10.215.4 %   Platelets 263 150 - 379 x10E3/uL   Neutrophils 49 Not Estab. %   Lymphs 39 Not Estab. %   Monocytes 8 Not Estab. %   Eos 3 Not Estab. %   Basos 1 Not Estab. %   Neutrophils Absolute 3.0 1.4 - 7.0 x10E3/uL   Lymphocytes Absolute 2.4 0.7 - 3.1  x10E3/uL   Monocytes Absolute 0.5 0.1 - 0.9 x10E3/uL   EOS (ABSOLUTE) 0.2 0.0 - 0.4 x10E3/uL   Basophils Absolute 0.1 0.0 - 0.2 x10E3/uL   Immature Granulocytes 0 Not Estab. %   Immature Grans (Abs) 0.0 0.0 - 0.1 x10E3/uL  Comprehensive metabolic panel  Result Value Ref Range   Glucose 64 (L) 65 - 99 mg/dL   BUN 14 6 - 24 mg/dL   Creatinine, Ser 1.61 0.57 - 1.00 mg/dL   GFR calc non Af Amer 85 >59 mL/min/1.73   GFR calc Af Amer 98 >59 mL/min/1.73   BUN/Creatinine Ratio 18 9 - 23   Sodium 143 134 - 144 mmol/L   Potassium 4.1 3.5 - 5.2 mmol/L   Chloride 101 96 - 106 mmol/L   CO2 25 18 - 29 mmol/L   Calcium 8.9 8.7 - 10.2 mg/dL   Total Protein 6.5 6.0 - 8.5 g/dL   Albumin 4.3 3.5 - 5.5 g/dL   Globulin, Total 2.2 1.5 - 4.5 g/dL   Albumin/Globulin Ratio 2.0 1.2 - 2.2   Bilirubin Total 0.3 0.0  - 1.2 mg/dL   Alkaline Phosphatase 91 39 - 117 IU/L   AST 29 0 - 40 IU/L   ALT 27 0 - 32 IU/L  Lipid Panel w/o Chol/HDL Ratio  Result Value Ref Range   Cholesterol, Total 150 100 - 199 mg/dL   Triglycerides 99 0 - 149 mg/dL   HDL 62 >09 mg/dL   VLDL Cholesterol Cal 20 5 - 40 mg/dL   LDL Calculated 68 0 - 99 mg/dL  TSH  Result Value Ref Range   TSH 1.470 0.450 - 4.500 uIU/mL  UA/M w/rflx Culture, Routine  Result Value Ref Range   Specific Gravity, UA <1.005 (L) 1.005 - 1.030   pH, UA 7.0 5.0 - 7.5   Color, UA Yellow Yellow   Appearance Ur Clear Clear   Leukocytes, UA Negative Negative   Protein, UA Negative Negative/Trace   Glucose, UA Negative Negative   Ketones, UA Negative Negative   RBC, UA Negative Negative   Bilirubin, UA Negative Negative   Urobilinogen, Ur 0.2 0.2 - 1.0 mg/dL   Nitrite, UA Negative Negative   Microscopic Examination See below:   HgB A1c  Result Value Ref Range   Hgb A1c MFr Bld 5.5 4.8 - 5.6 %   Est. average glucose Bld gHb Est-mCnc 111 mg/dL      Assessment & Plan:   Problem List Items Addressed This Visit    None    Visit Diagnoses    Acute sinusitis, recurrence not specified, unspecified location    -  Primary   Relevant Medications   amoxicillin-clavulanate (AUGMENTIN) 875-125 MG tablet   fluconazole (DIFLUCAN) 150 MG tablet    Discussed sinusitis care and treatment use of over-the-counter medications Mucinex Sudafed Tylenol nasal rinse.  Also discussed yeast infection after antibiotics gave Diflucan  Follow up plan: Return if symptoms worsen or fail to improve, for As scheduled.

## 2018-01-25 ENCOUNTER — Encounter: Payer: Self-pay | Admitting: Family Medicine

## 2018-01-25 ENCOUNTER — Ambulatory Visit: Payer: BC Managed Care – PPO | Admitting: Family Medicine

## 2018-01-25 VITALS — BP 102/70 | HR 72 | Temp 98.9°F | Wt 155.2 lb

## 2018-01-25 DIAGNOSIS — J069 Acute upper respiratory infection, unspecified: Secondary | ICD-10-CM

## 2018-01-25 MED ORDER — AMOXICILLIN-POT CLAVULANATE 875-125 MG PO TABS: 1.0000 | ORAL_TABLET | Freq: Two times a day (BID) | ORAL | 0 refills | Status: DC

## 2018-01-25 MED ORDER — PREDNISONE 50 MG PO TABS: 50.0000 mg | ORAL_TABLET | Freq: Every day | ORAL | 0 refills | Status: DC

## 2018-01-25 NOTE — Progress Notes (Signed)
BP 102/70 (BP Location: Left Arm, Patient Position: Sitting, Cuff Size: Normal)   Pulse 72   Temp 98.9 F (37.2 C)   Wt 155 lb 4 oz (70.4 kg)   LMP 04/30/2015 (Exact Date)   SpO2 96%   BMI 28.40 kg/m    Subjective:    Patient ID: Kimberly Yang, female    DOB: 05/06/1961, 57 y.o.   MRN: [id number]  HPI: Kimberly Yang is a 57 y.o. female  Chief Complaint  Patient presents with  . URI    X 4 days, patient was in about a month ago with a sinus infection, she sattes that she does not think that it went away.    UPPER RESPIRATORY TRACT INFECTION Duration: been sick about a month ago, was treated with amoxicillin, went away, but then came back 4 days ago Worst symptom: congestion Fever: no Cough: yes Shortness of breath: no Wheezing: no Chest pain: no Chest tightness: no Chest congestion: no Nasal congestion: yes Runny nose: yes Post nasal drip: yes Sneezing: no Sore throat: no Swollen glands: no Sinus pressure: yes Headache: yes Face pain: yes Toothache: yes Ear pain: no  Ear pressure: yes bilateral Eyes red/itching:no Eye drainage/crusting: no  Vomiting: no Rash: no Fatigue: yes Sick contacts: yes Strep contacts: no  Context: worse Recurrent sinusitis: yes Relief with OTC cold/cough medications: no  Treatments attempted: pseudoephedrine    Relevant past medical, surgical, family and social history reviewed and updated as indicated. Interim medical history since our last visit reviewed. Allergies and medications reviewed and updated.  Review of Systems  Constitutional: Positive for fatigue. Negative for activity change, appetite change, chills, diaphoresis, fever and unexpected weight change.  HENT: Positive for congestion, postnasal drip, rhinorrhea, sinus pressure, sinus pain and sore throat. Negative for dental problem, drooling, ear discharge, ear pain, facial swelling, hearing loss, mouth sores, nosebleeds, sneezing, tinnitus, trouble swallowing and voice  change.   Eyes: Negative.   Respiratory: Negative.   Cardiovascular: Negative.   Psychiatric/Behavioral: Negative.     Per HPI unless specifically indicated above     Objective:    BP 102/70 (BP Location: Left Arm, Patient Position: Sitting, Cuff Size: Normal)   Pulse 72   Temp 98.9 F (37.2 C)   Wt 155 lb 4 oz (70.4 kg)   LMP 04/30/2015 (Exact Date)   SpO2 96%   BMI 28.40 kg/m   Wt Readings from Last 3 Encounters:  01/25/18 155 lb 4 oz (70.4 kg)  12/28/17 158 lb (71.7 kg)  08/29/17 150 lb (68 kg)    Physical Exam  Constitutional: She is oriented to person, place, and time. She appears well-developed and well-nourished. No distress.  HENT:  Head: Normocephalic and atraumatic.  Right Ear: Hearing and external ear normal.  Left Ear: Hearing and external ear normal.  Nose: Nose normal.  Mouth/Throat: Oropharynx is clear and moist. No oropharyngeal exudate.  Eyes: Conjunctivae, EOM and lids are normal. Pupils are equal, round, and reactive to light. Right eye exhibits no discharge. Left eye exhibits no discharge. No scleral icterus.  Neck: Normal range of motion. Neck supple. No JVD present. No tracheal deviation present. No thyromegaly present.  Cardiovascular: Normal rate, regular rhythm, normal heart sounds and intact distal pulses. Exam reveals no gallop and no friction rub.  No murmur heard. Pulmonary/Chest: Effort normal and breath sounds normal. No stridor. No respiratory distress. She has no wheezes. She has no rales. She exhibits no tenderness.  Musculoskeletal: Normal range of motion.  Lymphadenopathy:    She has no cervical adenopathy.  Neurological: She is alert and oriented to person, place, and time.  Skin: Skin is warm, dry and intact. No rash noted. She is not diaphoretic. No erythema. No pallor.  Psychiatric: She has a normal mood and affect. Her speech is normal and behavior is normal. Judgment and thought content normal. Cognition and memory are normal.    Nursing note and vitals reviewed.   Results for orders placed or performed in visit on 04/17/17  Microscopic Examination  Result Value Ref Range   WBC, UA None seen 0 - 5 /hpf   RBC, UA None seen 0 - 2 /hpf   Epithelial Cells (non renal) CANCELED    Bacteria, UA None seen None seen/Few  CBC with Differential/Platelet  Result Value Ref Range   WBC 6.2 3.4 - 10.8 x10E3/uL   RBC 4.33 3.77 - 5.28 x10E6/uL   Hemoglobin 13.2 11.1 - 15.9 g/dL   Hematocrit 16.141.4 09.634.0 - 46.6 %   MCV 96 79 - 97 fL   MCH 30.5 26.6 - 33.0 pg   MCHC 31.9 31.5 - 35.7 g/dL   RDW 04.513.2 40.912.3 - 81.115.4 %   Platelets 263 150 - 379 x10E3/uL   Neutrophils 49 Not Estab. %   Lymphs 39 Not Estab. %   Monocytes 8 Not Estab. %   Eos 3 Not Estab. %   Basos 1 Not Estab. %   Neutrophils Absolute 3.0 1.4 - 7.0 x10E3/uL   Lymphocytes Absolute 2.4 0.7 - 3.1 x10E3/uL   Monocytes Absolute 0.5 0.1 - 0.9 x10E3/uL   EOS (ABSOLUTE) 0.2 0.0 - 0.4 x10E3/uL   Basophils Absolute 0.1 0.0 - 0.2 x10E3/uL   Immature Granulocytes 0 Not Estab. %   Immature Grans (Abs) 0.0 0.0 - 0.1 x10E3/uL  Comprehensive metabolic panel  Result Value Ref Range   Glucose 64 (L) 65 - 99 mg/dL   BUN 14 6 - 24 mg/dL   Creatinine, Ser 9.140.77 0.57 - 1.00 mg/dL   GFR calc non Af Amer 85 >59 mL/min/1.73   GFR calc Af Amer 98 >59 mL/min/1.73   BUN/Creatinine Ratio 18 9 - 23   Sodium 143 134 - 144 mmol/L   Potassium 4.1 3.5 - 5.2 mmol/L   Chloride 101 96 - 106 mmol/L   CO2 25 18 - 29 mmol/L   Calcium 8.9 8.7 - 10.2 mg/dL   Total Protein 6.5 6.0 - 8.5 g/dL   Albumin 4.3 3.5 - 5.5 g/dL   Globulin, Total 2.2 1.5 - 4.5 g/dL   Albumin/Globulin Ratio 2.0 1.2 - 2.2   Bilirubin Total 0.3 0.0 - 1.2 mg/dL   Alkaline Phosphatase 91 39 - 117 IU/L   AST 29 0 - 40 IU/L   ALT 27 0 - 32 IU/L  Lipid Panel w/o Chol/HDL Ratio  Result Value Ref Range   Cholesterol, Total 150 100 - 199 mg/dL   Triglycerides 99 0 - 149 mg/dL   HDL 62 >78>39 mg/dL   VLDL Cholesterol Cal 20 5 -  40 mg/dL   LDL Calculated 68 0 - 99 mg/dL  TSH  Result Value Ref Range   TSH 1.470 0.450 - 4.500 uIU/mL  UA/M w/rflx Culture, Routine  Result Value Ref Range   Specific Gravity, UA <1.005 (L) 1.005 - 1.030   pH, UA 7.0 5.0 - 7.5   Color, UA Yellow Yellow   Appearance Ur Clear Clear   Leukocytes, UA Negative Negative   Protein, UA Negative  Negative/Trace   Glucose, UA Negative Negative   Ketones, UA Negative Negative   RBC, UA Negative Negative   Bilirubin, UA Negative Negative   Urobilinogen, Ur 0.2 0.2 - 1.0 mg/dL   Nitrite, UA Negative Negative   Microscopic Examination See below:   HgB A1c  Result Value Ref Range   Hgb A1c MFr Bld 5.5 4.8 - 5.6 %   Est. average glucose Bld gHb Est-mCnc 111 mg/dL      Assessment & Plan:   Problem List Items Addressed This Visit    None    Visit Diagnoses    Viral upper respiratory tract infection    -  Primary   Will treat with prednisone burst. If not getting better, will fill augmentin Rx. Call if not getting better or getting worse.        Follow up plan: Return if symptoms worsen or fail to improve.

## 2018-05-20 ENCOUNTER — Encounter: Payer: Self-pay | Admitting: Physician Assistant

## 2018-05-20 ENCOUNTER — Ambulatory Visit (INDEPENDENT_AMBULATORY_CARE_PROVIDER_SITE_OTHER): Payer: BC Managed Care – PPO | Admitting: Physician Assistant

## 2018-05-20 VITALS — BP 118/77 | HR 62 | Temp 98.6°F | Ht 62.5 in | Wt 163.2 lb

## 2018-05-20 DIAGNOSIS — G4700 Insomnia, unspecified: Secondary | ICD-10-CM | POA: Diagnosis not present

## 2018-05-20 DIAGNOSIS — Z1231 Encounter for screening mammogram for malignant neoplasm of breast: Secondary | ICD-10-CM | POA: Diagnosis not present

## 2018-05-20 DIAGNOSIS — Z1239 Encounter for other screening for malignant neoplasm of breast: Secondary | ICD-10-CM

## 2018-05-20 DIAGNOSIS — E785 Hyperlipidemia, unspecified: Secondary | ICD-10-CM | POA: Diagnosis not present

## 2018-05-20 DIAGNOSIS — Z0000 Encounter for general adult medical examination without abnormal findings: Secondary | ICD-10-CM

## 2018-05-20 DIAGNOSIS — Z1329 Encounter for screening for other suspected endocrine disorder: Secondary | ICD-10-CM

## 2018-05-20 DIAGNOSIS — K7689 Other specified diseases of liver: Secondary | ICD-10-CM

## 2018-05-20 DIAGNOSIS — I10 Essential (primary) hypertension: Secondary | ICD-10-CM | POA: Diagnosis not present

## 2018-05-20 DIAGNOSIS — Z0001 Encounter for general adult medical examination with abnormal findings: Secondary | ICD-10-CM

## 2018-05-20 MED ORDER — TRAZODONE HCL 50 MG PO TABS: 25.0000 mg | ORAL_TABLET | Freq: Every evening | ORAL | 0 refills | Status: DC | PRN

## 2018-05-20 MED ORDER — BISOPROLOL-HYDROCHLOROTHIAZIDE 5-6.25 MG PO TABS: 1.0000 | ORAL_TABLET | Freq: Every day | ORAL | 3 refills | Status: DC

## 2018-05-20 MED ORDER — ATORVASTATIN CALCIUM 20 MG PO TABS: 20.0000 mg | ORAL_TABLET | Freq: Every day | ORAL | 3 refills | Status: DC

## 2018-05-20 NOTE — Progress Notes (Signed)
Subjective:    Patient ID: Kimberly Yang, female    DOB: [birth year], 59 y.o.   MRN: [id number]  Kimberly Yang is a 57 y.o. female presenting on 05/20/2018 for Annual Exam   HPI   Works in school system, living in [place].  Colonoscopy - 2013 Jefm [place], [place] 2018: patient wants mammogram yearly, no family history - declines breast exam, will get mammo PAP: total hysterectomy by Dr. Leafy Ro 2016 for abnormal uterine bleeding, no cancer  Has a history of liver cyst monitored by Tammi Klippel at [facility]. Needs her liver function tests forwarded to her.   HTN: Controlled on [place] HLD: Tolerating 20 mg lipitor well.   Mentions insomnia. Has been using ibuprofen PM nightly which has stopped working.   Past Medical History:  Diagnosis Date  . Allergic rhinitis   . Headache   . Hyperlipidemia   . Sinusitis   . Vaginitis    Past Surgical History:  Procedure Laterality Date  . ABDOMINAL HYSTERECTOMY    . COLONOSCOPY    . CYSTOSCOPY N/A 05/21/2015   Procedure: CYSTOSCOPY;  Surgeon: Benjaman Kindler, MD;  Location: [facility] ORS;  Service: Gynecology;  Laterality: N/A;  . HYSTEROSCOPY W/D&C N/A 04/20/2015   Procedure: DILATATION AND CURETTAGE /HYSTEROSCOPY;  Surgeon: Benjaman Kindler, MD;  Location: [facility] ORS;  Service: Gynecology;  Laterality: N/A;  . LAPAROSCOPIC ASSISTED VAGINAL HYSTERECTOMY N/A 05/21/2015   Procedure: LAPAROSCOPIC ASSISTED VAGINAL HYSTERECTOMY;  Surgeon: Benjaman Kindler, MD;  Location: [facility] OR[facility];  Service: Gynecology;  Laterality: N/A;  . LAPAROSCOPIC SALPINGO OOPHERECTOMY Bilateral 05/21/2015   Procedure: LAPAROSCOPIC SALPINGO OOPHORECTOMY;  Surgeon: Benjaman Kindler, MD;  Location: [facility] ORS;  Service: Gynecology;  Laterality: Bilateral;  . NASAL SINUS SURGERY     Social History   Socioeconomic History  . Marital status: Divorced    Spouse name: Not on file  . Number of children: Not on file  . Years of education: Not on file  . Highest  education level: Not on file  Occupational History  . Not on file  Social Needs  . Financial resource strain: Not on file  . Food insecurity:    Worry: Not on file    Inability: Not on file  . Transportation needs:    Medical: Not on file    Non-medical: Not on file  Tobacco Use  . Smoking status: Never Smoker  . Smokeless tobacco: Never Used  Substance and Sexual Activity  . Alcohol use: No  . Drug use: No  . Sexual activity: Yes    Birth control/protection: Pill  Lifestyle  . Physical activity:    Days per week: Not on file    Minutes per session: Not on file  . Stress: Not on file  Relationships  . Social connections:    Talks on phone: Not on file    Gets together: Not on file    Attends religious service: Not on file    Active member of club or organization: Not on file    Attends meetings of clubs or organizations: Not on file    Relationship status: Not on file  . Intimate partner violence:    Fear of current or ex partner: Not on file    Emotionally abused: Not on file    Physically abused: Not on file    Forced sexual activity: Not on file  Other Topics Concern  . Not on file  Social History Narrative  . Not on file   History reviewed. No  pertinent family history. Current Outpatient Medications on File Prior to Visit  Medication Sig  . aspirin EC 81 MG tablet Take 81 mg by mouth daily.  . cholecalciferol (VITAMIN D) 1000 UNITS tablet Take 1,000 Units by mouth daily.  . fexofenadine-pseudoephedrine (ALLEGRA-D ALLERGY & CONGESTION) 180-240 MG 24 hr tablet Take 1 tablet by mouth daily. (Patient taking differently: Take 1 tablet by mouth daily as needed. )   No current facility-administered medications on file prior to visit.     Review of Systems Per HPI unless specifically indicated above       Objective:    BP 118/77   Pulse 62   Temp 98.6 F (37 C) (Oral)   Ht 5' 2.5" (1.588 m)   Wt 163 lb 3.2 oz (74 kg)   LMP 04/30/2015 (Exact Date)   SpO2  98%   BMI 29.37 kg/m   Wt Readings from Last 3 Encounters:  05/20/18 163 lb 3.2 oz (74 kg)  01/25/18 155 lb 4 oz (70.4 kg)  12/28/17 158 lb (71.7 kg)    Physical Exam  Constitutional: She is oriented to person, place, and time. She appears well-developed and well-nourished.  Cardiovascular: Normal rate and regular rhythm.  Pulmonary/Chest: Effort normal and breath sounds normal.  Abdominal: Soft. Bowel sounds are normal. She exhibits no mass. There is no tenderness.  Neurological: She is alert and oriented to person, place, and time.  Skin: Skin is warm and dry.  Psychiatric: She has a normal mood and affect. Her behavior is normal.   Results for orders placed or performed in visit on 04/17/17  Microscopic Examination  Result Value Ref Range   WBC, UA None seen 0 - 5 /hpf   RBC, UA None seen 0 - 2 /hpf   Epithelial Cells (non renal) CANCELED    Bacteria, UA None seen None seen/Few  CBC with Differential/Platelet  Result Value Ref Range   WBC 6.2 3.4 - 10.8 x10E3/uL   RBC 4.33 3.77 - 5.28 x10E6/uL   Hemoglobin 13.2 11.1 - 15.9 g/dL   Hematocrit 41.4 34.0 - 46.6 %   MCV 96 79 - 97 fL   MCH 30.5 26.6 - 33.0 pg   MCHC 31.9 31.5 - 35.7 g/dL   RDW 13.2 12.3 - 15.4 %   Platelets 263 150 - 379 x10E3/uL   Neutrophils 49 Not Estab. %   Lymphs 39 Not Estab. %   Monocytes 8 Not Estab. %   Eos 3 Not Estab. %   Basos 1 Not Estab. %   Neutrophils Absolute 3.0 1.4 - 7.0 x10E3/uL   Lymphocytes Absolute 2.4 0.7 - 3.1 x10E3/uL   Monocytes Absolute 0.5 0.1 - 0.9 x10E3/uL   EOS (ABSOLUTE) 0.2 0.0 - 0.4 x10E3/uL   Basophils Absolute 0.1 0.0 - 0.2 x10E3/uL   Immature Granulocytes 0 Not Estab. %   Immature Grans (Abs) 0.0 0.0 - 0.1 x10E3/uL  Comprehensive metabolic panel  Result Value Ref Range   Glucose 64 (L) 65 - 99 mg/dL   BUN 14 6 - 24 mg/dL   Creatinine, Ser 0.77 0.57 - 1.00 mg/dL   GFR calc non Af Amer 85 >59 mL/min/1.73   GFR calc Af Amer 98 >59 mL/min/1.73   BUN/Creatinine  Ratio 18 9 - 23   Sodium 143 134 - 144 mmol/L   Potassium 4.1 3.5 - 5.2 mmol/L   Chloride 101 96 - 106 mmol/L   CO2 25 18 - 29 mmol/L   Calcium 8.9 8.7 - 10.2  mg/dL   Total Protein 6.5 6.0 - 8.5 g/dL   Albumin 4.3 3.5 - 5.5 g/dL   Globulin, Total 2.2 1.5 - 4.5 g/dL   Albumin/Globulin Ratio 2.0 1.2 - 2.2   Bilirubin Total 0.3 0.0 - 1.2 mg/dL   Alkaline Phosphatase 91 39 - 117 IU/L   AST 29 0 - 40 IU/L   ALT 27 0 - 32 IU/L  Lipid Panel w/o Chol/HDL Ratio  Result Value Ref Range   Cholesterol, Total 150 100 - 199 mg/dL   Triglycerides 99 0 - 149 mg/dL   HDL 62 >39 mg/dL   VLDL Cholesterol Cal 20 5 - 40 mg/dL   LDL Calculated 68 0 - 99 mg/dL  TSH  Result Value Ref Range   TSH 1.470 0.450 - 4.500 uIU/mL  UA/M w/rflx Culture, Routine  Result Value Ref Range   Specific Gravity, UA <1.005 (L) 1.005 - 1.030   pH, UA 7.0 5.0 - 7.5   Color, UA Yellow Yellow   Appearance Ur Clear Clear   Leukocytes, UA Negative Negative   Protein, UA Negative Negative/Trace   Glucose, UA Negative Negative   Ketones, UA Negative Negative   RBC, UA Negative Negative   Bilirubin, UA Negative Negative   Urobilinogen, Ur 0.2 0.2 - 1.0 mg/dL   Nitrite, UA Negative Negative   Microscopic Examination See below:   HgB A1c  Result Value Ref Range   Hgb A1c MFr Bld 5.5 4.8 - 5.6 %   Est. average glucose Bld gHb Est-mCnc 111 mg/dL      Assessment & Plan:  1. Annual physical exam  Does not need PAP. Colonoscopy UTD. Getting mammo.  2. Breast cancer screening  Knows to self schedule w/ [place].   - MM Digital Screening; Future  3. Hyperlipidemia, unspecified hyperlipidemia type  Refill Lipitor, check labs.  - Lipid Profile - atorvastatin (LIPITOR) 20 MG tablet; Take 1 tablet (20 mg total) by mouth at bedtime.  Dispense: 90 tablet; Refill: 3  4. Essential hypertension  - Comp Met (CMET) - bisoprolol-hydrochlorothiazide (ZIAC) 5-6.25 MG tablet; Take 1 tablet by mouth daily.  Dispense: 90  tablet; Refill: 3  5. Liver cyst  Will forward labs to [facility].  - CBC with Differential  6. Thyroid disorder screening  - TSH  7. Insomnia, unspecified type  Start trazodone as below. Counseled on good sleep hygiene including no caffeine, alcohol, napping, electronics before bed.  - traZODone (DESYREL) 50 MG tablet; Take 0.5-1 tablets (25-50 mg total) by mouth at bedtime as needed for sleep.  Dispense: 30 tablet; Refill: 0   Follow up plan: Return in about 1 month (around 06/19/2018) for insomnia .  Carles Collet, PA-C [facility] 05/20/2018, 8:37 AM

## 2018-05-20 NOTE — Patient Instructions (Signed)

## 2018-05-21 LAB — LIPID PANEL
Chol/HDL Ratio: 2.3 ratio (ref 0.0–4.4)
Cholesterol, Total: 160 mg/dL (ref 100–199)
HDL: 69 mg/dL (ref >39)
LDL Calculated: 79 mg/dL (ref 0–99)
Triglycerides: 58 mg/dL (ref 0–149)
VLDL Cholesterol Cal: 12 mg/dL (ref 5–40)

## 2018-05-21 LAB — COMPREHENSIVE METABOLIC PANEL
ALT: 24 IU/L (ref 0–32)
AST: 27 IU/L (ref 0–40)
Albumin/Globulin Ratio: 1.9 (ref 1.2–2.2)
Albumin: 4.3 g/dL (ref 3.5–5.5)
Alkaline Phosphatase: 92 IU/L (ref 39–117)
BUN/Creatinine Ratio: 21 (ref 9–23)
BUN: 16 mg/dL (ref 6–24)
Bilirubin Total: <0.2 mg/dL (ref 0.0–1.2)
CO2: 26 mmol/L (ref 20–29)
Calcium: 9.2 mg/dL (ref 8.7–10.2)
Chloride: 111 mmol/L — ABNORMAL HIGH (ref 96–106)
Creatinine, Ser: 0.76 mg/dL (ref 0.57–1.00)
GFR calc Af Amer: 99 mL/min/{1.73_m2} (ref >59)
GFR calc non Af Amer: 86 mL/min/{1.73_m2} (ref >59)
Globulin, Total: 2.3 g/dL (ref 1.5–4.5)
Glucose: 92 mg/dL (ref 65–99)
Potassium: 4.7 mmol/L (ref 3.5–5.2)
Sodium: 137 mmol/L (ref 134–144)
Total Protein: 6.6 g/dL (ref 6.0–8.5)

## 2018-05-21 LAB — TSH: TSH: 3.13 u[IU]/mL (ref 0.450–4.500)

## 2018-05-26 ENCOUNTER — Other Ambulatory Visit: Payer: Self-pay

## 2018-05-26 DIAGNOSIS — K7689 Other specified diseases of liver: Secondary | ICD-10-CM

## 2018-05-27 ENCOUNTER — Other Ambulatory Visit: Payer: BC Managed Care – PPO

## 2018-05-27 DIAGNOSIS — K7689 Other specified diseases of liver: Secondary | ICD-10-CM

## 2018-05-28 LAB — CBC WITH DIFFERENTIAL/PLATELET
Basophils Absolute: 0 10*3/uL (ref 0.0–0.2)
Basos: 1 %
EOS (ABSOLUTE): 0.1 10*3/uL (ref 0.0–0.4)
Eos: 2 %
Hematocrit: 37.6 % (ref 34.0–46.6)
Hemoglobin: 12.8 g/dL (ref 11.1–15.9)
Immature Grans (Abs): 0 10*3/uL (ref 0.0–0.1)
Immature Granulocytes: 0 %
Lymphocytes Absolute: 2.2 10*3/uL (ref 0.7–3.1)
Lymphs: 34 %
MCH: 30.8 pg (ref 26.6–33.0)
MCHC: 34 g/dL (ref 31.5–35.7)
MCV: 91 fL (ref 79–97)
Monocytes Absolute: 0.6 10*3/uL (ref 0.1–0.9)
Monocytes: 10 %
Neutrophils Absolute: 3.4 10*3/uL (ref 1.4–7.0)
Neutrophils: 53 %
Platelets: 250 10*3/uL (ref 150–450)
RBC: 4.15 x10E6/uL (ref 3.77–5.28)
RDW: 12 % — ABNORMAL LOW (ref 12.3–15.4)
WBC: 6.4 10*3/uL (ref 3.4–10.8)

## 2018-06-09 ENCOUNTER — Ambulatory Visit: Payer: BC Managed Care – PPO

## 2018-06-15 ENCOUNTER — Ambulatory Visit (INDEPENDENT_AMBULATORY_CARE_PROVIDER_SITE_OTHER): Payer: BC Managed Care – PPO | Admitting: Family Medicine

## 2018-06-15 ENCOUNTER — Encounter: Payer: Self-pay | Admitting: Family Medicine

## 2018-06-15 DIAGNOSIS — G4700 Insomnia, unspecified: Secondary | ICD-10-CM | POA: Diagnosis not present

## 2018-06-15 DIAGNOSIS — I10 Essential (primary) hypertension: Secondary | ICD-10-CM | POA: Diagnosis not present

## 2018-06-15 DIAGNOSIS — G43909 Migraine, unspecified, not intractable, without status migrainosus: Secondary | ICD-10-CM | POA: Insufficient documentation

## 2018-06-15 DIAGNOSIS — E785 Hyperlipidemia, unspecified: Secondary | ICD-10-CM

## 2018-06-15 MED ORDER — TRAZODONE HCL 50 MG PO TABS
25.0000 mg | ORAL_TABLET | Freq: Every evening | ORAL | 11 refills | Status: DC | PRN
Start: 2018-06-15 — End: 2021-06-18

## 2018-06-15 NOTE — Progress Notes (Signed)
BP 122/83   Pulse 64   Ht 5\' 4"  (1.626 m)   Wt 163 lb (73.9 kg)   LMP 04/30/2015 (Exact Date)   SpO2 98%   BMI 27.98 kg/m    Subjective:    Patient ID: Kimberly Yang, female    DOB: [birth year], 59 y.o.   MRN: [id number]  HPI: Kimberly Yang is a 57 y.o. female  Chief Complaint  Patient presents with  . Follow-up    Insomnia - better on 1/2 tablet (25 mg) of trazadone   Patient follow-up doing well insomnia with trazodone well at half a tablet taking several a week but not every night. Has done well with blood pressure and cholesterol medication reviewed labs which were all normal.  Relevant past medical, surgical, family and social history reviewed and updated as indicated. Interim medical history since our last visit reviewed. Allergies and medications reviewed and updated.  Review of Systems  Constitutional: Negative.   Respiratory: Negative.   Cardiovascular: Negative.     Per HPI unless specifically indicated above     Objective:    BP 122/83   Pulse 64   Ht 5\' 4"  (1.626 m)   Wt 163 lb (73.9 kg)   LMP 04/30/2015 (Exact Date)   SpO2 98%   BMI 27.98 kg/m   Wt Readings from Last 3 Encounters:  06/15/18 163 lb (73.9 kg)  05/20/18 163 lb 3.2 oz (74 kg)  01/25/18 155 lb 4 oz (70.4 kg)    Physical Exam  Constitutional: She is oriented to person, place, and time. She appears well-developed and well-nourished.  HENT:  Head: Normocephalic and atraumatic.  Eyes: Conjunctivae and EOM are normal.  Neck: Normal range of motion.  Cardiovascular: Normal rate, regular rhythm and normal heart sounds.  Pulmonary/Chest: Effort normal and breath sounds normal.  Musculoskeletal: Normal range of motion.  Neurological: She is alert and oriented to person, place, and time.  Skin: No erythema.  Psychiatric: She has a normal mood and affect. Her behavior is normal. Judgment and thought content normal.    Results for orders placed or performed in visit on 05/27/18  CBC with  Differential/Platelet  Result Value Ref Range   WBC 6.4 3.4 - 10.8 x10E3/uL   RBC 4.15 3.77 - 5.28 x10E6/uL   Hemoglobin 12.8 11.1 - 15.9 g/dL   Hematocrit 78.2 95.6 - 46.6 %   MCV 91 79 - 97 fL   MCH 30.8 26.6 - 33.0 pg   MCHC 34.0 31.5 - 35.7 g/dL   RDW 21.3 (L) 08.6 - 57.8 %   Platelets 250 150 - 450 x10E3/uL   Neutrophils 53 Not Estab. %   Lymphs 34 Not Estab. %   Monocytes 10 Not Estab. %   Eos 2 Not Estab. %   Basos 1 Not Estab. %   Neutrophils Absolute 3.4 1.4 - 7.0 x10E3/uL   Lymphocytes Absolute 2.2 0.7 - 3.1 x10E3/uL   Monocytes Absolute 0.6 0.1 - 0.9 x10E3/uL   EOS (ABSOLUTE) 0.1 0.0 - 0.4 x10E3/uL   Basophils Absolute 0.0 0.0 - 0.2 x10E3/uL   Immature Granulocytes 0 Not Estab. %   Immature Grans (Abs) 0.0 0.0 - 0.1 x10E3/uL      Assessment & Plan:   Problem List Items Addressed This Visit      Cardiovascular and Mediastinum   Hypertension    The current medical regimen is effective;  continue present plan and medications.         Other  Hyperlipidemia    Patient doing well with medication reviewed lab results and discuss results with patient.      Insomnia    Discussed insomnia care and treatment use of trazodone patient is an appropriately discussed alternative medications also.      Relevant Medications   traZODone (DESYREL) 50 MG tablet       Follow up plan: Return in about 6 months (around 12/16/2018) for BMP,  Lipids, ALT, AST.

## 2018-06-15 NOTE — Assessment & Plan Note (Signed)
Patient doing well with medication reviewed lab results and discuss results with patient.

## 2018-06-15 NOTE — Assessment & Plan Note (Signed)
The current medical regimen is effective;  continue present plan and medications.  

## 2018-06-15 NOTE — Assessment & Plan Note (Signed)
Discussed insomnia care and treatment use of trazodone patient is an appropriately discussed alternative medications also.

## 2018-06-22 ENCOUNTER — Ambulatory Visit
Admission: RE | Admit: 2018-06-22 | Discharge: 2018-06-22 | Disposition: A | Discharge status: Home / Self Care | Payer: BC Managed Care – PPO | Source: Ambulatory Visit | Attending: Physician Assistant | Admitting: Physician Assistant

## 2018-06-22 DIAGNOSIS — Z1231 Encounter for screening mammogram for malignant neoplasm of breast: Secondary | ICD-10-CM | POA: Insufficient documentation

## 2018-06-22 DIAGNOSIS — Z1239 Encounter for other screening for malignant neoplasm of breast: Secondary | ICD-10-CM

## 2018-08-04 ENCOUNTER — Ambulatory Visit: Payer: BC Managed Care – PPO | Admitting: Physician Assistant

## 2018-08-04 ENCOUNTER — Encounter: Payer: Self-pay | Admitting: Physician Assistant

## 2018-08-04 VITALS — BP 127/81 | HR 66 | Temp 98.3°F | Ht 64.0 in | Wt 164.4 lb

## 2018-08-04 DIAGNOSIS — J019 Acute sinusitis, unspecified: Secondary | ICD-10-CM

## 2018-08-04 MED ORDER — DOXYCYCLINE HYCLATE 100 MG PO TABS: 100.0000 mg | ORAL_TABLET | Freq: Two times a day (BID) | ORAL | 0 refills | Status: AC

## 2018-08-04 NOTE — Patient Instructions (Signed)

## 2018-08-04 NOTE — Progress Notes (Signed)
Subjective:    Patient ID: Kimberly Yang, female    DOB: 08/21/1961, 57 y.o.   MRN: [id number]  Kimberly Yang is a 57 y.o. female presenting on 08/04/2018 for URI (pt states she has had sinus pressure, drainage, and congestion for about a week. States she has taken mucinex and sudaffed with no relief. )   HPI   History of sinus surgery done byDr. Willeen Cass from ENT in [place]. Has had facial and nasal congestion worsening x 1 week. Taking xyzal. Has taken sudafed, not cleared up. Denies fevers, chills, SOB.  Social History   Tobacco Use  . Smoking status: Never Smoker  . Smokeless tobacco: Never Used  Substance Use Topics  . Alcohol use: No  . Drug use: No    Review of Systems Per HPI unless specifically indicated above     Objective:    BP 127/81   Pulse 66   Temp 98.3 F (36.8 C) (Oral)   Ht 5\' 4"  (1.626 m)   Wt 164 lb 6.4 oz (74.6 kg)   LMP 04/30/2015 (Exact Date)   SpO2 98%   BMI 28.22 kg/m   Wt Readings from Last 3 Encounters:  08/04/18 164 lb 6.4 oz (74.6 kg)  06/15/18 163 lb (73.9 kg)  05/20/18 163 lb 3.2 oz (74 kg)    Physical Exam  Constitutional: She is oriented to person, place, and time. She appears well-developed and well-nourished. No distress.  HENT:  Right Ear: External ear normal.  Left Ear: External ear normal.  Nose: Right sinus exhibits maxillary sinus tenderness and frontal sinus tenderness. Left sinus exhibits maxillary sinus tenderness and frontal sinus tenderness.  Mouth/Throat: Oropharynx is clear and moist. No oropharyngeal exudate, posterior oropharyngeal edema or posterior oropharyngeal erythema.  Tms opaque bilaterally   Eyes: Conjunctivae are normal. Right eye exhibits no discharge. Left eye exhibits no discharge.  Neck: Neck supple.  Cardiovascular: Normal rate and regular rhythm.  Pulmonary/Chest: Effort normal and breath sounds normal.  Lymphadenopathy:    She has cervical adenopathy.  Neurological: She is alert and oriented to  person, place, and time.  Skin: Skin is warm and dry. She is not diaphoretic.  Psychiatric: She has a normal mood and affect. Her behavior is normal.   Results for orders placed or performed in visit on 05/27/18  CBC with Differential/Platelet  Result Value Ref Range   WBC 6.4 3.4 - 10.8 x10E3/uL   RBC 4.15 3.77 - 5.28 x10E6/uL   Hemoglobin 12.8 11.1 - 15.9 g/dL   Hematocrit 40.9 81.1 - 46.6 %   MCV 91 79 - 97 fL   MCH 30.8 26.6 - 33.0 pg   MCHC 34.0 31.5 - 35.7 g/dL   RDW 91.4 (L) 78.2 - 95.6 %   Platelets 250 150 - 450 x10E3/uL   Neutrophils 53 Not Estab. %   Lymphs 34 Not Estab. %   Monocytes 10 Not Estab. %   Eos 2 Not Estab. %   Basos 1 Not Estab. %   Neutrophils Absolute 3.4 1.4 - 7.0 x10E3/uL   Lymphocytes Absolute 2.2 0.7 - 3.1 x10E3/uL   Monocytes Absolute 0.6 0.1 - 0.9 x10E3/uL   EOS (ABSOLUTE) 0.1 0.0 - 0.4 x10E3/uL   Basophils Absolute 0.0 0.0 - 0.2 x10E3/uL   Immature Granulocytes 0 Not Estab. %   Immature Grans (Abs) 0.0 0.0 - 0.1 x10E3/uL      Assessment & Plan:  1. Acute non-recurrent sinusitis, unspecified location  - doxycycline (VIBRA-TABS) 100 MG  tablet; Take 1 tablet (100 mg total) by mouth 2 (two) times daily for 10 days.  Dispense: 20 tablet; Refill: 0    Follow up plan: Return if symptoms worsen or fail to improve.  Osvaldo Angst, PA-C [facility] 08/04/2018, 3:35 PM

## 2018-10-07 ENCOUNTER — Ambulatory Visit: Payer: BC Managed Care – PPO | Admitting: Family Medicine

## 2018-10-07 ENCOUNTER — Encounter: Payer: Self-pay | Admitting: Family Medicine

## 2018-10-07 VITALS — BP 119/77 | HR 67 | Temp 99.0°F | Wt 164.0 lb

## 2018-10-07 DIAGNOSIS — J069 Acute upper respiratory infection, unspecified: Secondary | ICD-10-CM | POA: Diagnosis not present

## 2018-10-07 MED ORDER — FLUTICASONE PROPIONATE 50 MCG/ACT NA SUSP: 1.0000 | Freq: Two times a day (BID) | NASAL | 6 refills | Status: DC

## 2018-10-07 MED ORDER — LIDOCAINE VISCOUS HCL 2 % MT SOLN: 5.0000 mL | OROMUCOSAL | 0 refills | Status: DC | PRN

## 2018-10-07 MED ORDER — BENZONATATE 200 MG PO CAPS: 200.0000 mg | ORAL_CAPSULE | Freq: Two times a day (BID) | ORAL | 0 refills | Status: DC | PRN

## 2018-10-07 MED ORDER — AMOXICILLIN-POT CLAVULANATE 875-125 MG PO TABS: 1.0000 | ORAL_TABLET | Freq: Two times a day (BID) | ORAL | 0 refills | Status: DC

## 2018-10-07 NOTE — Progress Notes (Signed)
BP 119/77   Pulse 67   Temp 99 F (37.2 C) (Oral)   Wt 164 lb (74.4 kg)   LMP 04/30/2015 (Exact Date)   SpO2 98%   BMI 28.15 kg/m    Subjective:    Patient ID: Kimberly Yang, female    DOB: 08-24-1961, 57 y.o.   MRN: [id number]  HPI: SUETTA HOFFMEISTER is a 57 y.o. female  Chief Complaint  Patient presents with  . Sore Throat    Started last friday. Denies fevers.   . Nasal Congestion   Here today with about 1 week of sore throat and nasal congestion. Sxs worsening over duration, now also having a hacking cough and malaise/fatigue. Trying OTC cold and sinus medications with no relief. Several sick contacts. Hx of allergic rhinitis not currently on medications.   Relevant past medical, surgical, family and social history reviewed and updated as indicated. Interim medical history since our last visit reviewed. Allergies and medications reviewed and updated.  Review of Systems  Per HPI unless specifically indicated above     Objective:    BP 119/77   Pulse 67   Temp 99 F (37.2 C) (Oral)   Wt 164 lb (74.4 kg)   LMP 04/30/2015 (Exact Date)   SpO2 98%   BMI 28.15 kg/m   Wt Readings from Last 3 Encounters:  10/07/18 164 lb (74.4 kg)  08/04/18 164 lb 6.4 oz (74.6 kg)  06/15/18 163 lb (73.9 kg)    Physical Exam  Constitutional: She is oriented to person, place, and time. She appears well-developed and well-nourished. No distress.  HENT:  Head: Atraumatic.  Right Ear: External ear normal.  Left Ear: External ear normal.  Oropharynx and nasal mucosa erythematous and edematous with thick drainage present  Eyes: Conjunctivae and EOM are normal.  Neck: Normal range of motion. Neck supple.  Cardiovascular: Normal rate, regular rhythm and normal heart sounds.  Pulmonary/Chest: Effort normal and breath sounds normal.  Musculoskeletal: Normal range of motion.  Lymphadenopathy:    She has no cervical adenopathy.  Neurological: She is alert and oriented to person, place, and  time.  Skin: Skin is warm and dry.  Psychiatric: She has a normal mood and affect. Her behavior is normal.  Nursing note and vitals reviewed.   Results for orders placed or performed in visit on 05/27/18  CBC with Differential/Platelet  Result Value Ref Range   WBC 6.4 3.4 - 10.8 x10E3/uL   RBC 4.15 3.77 - 5.28 x10E6/uL   Hemoglobin 12.8 11.1 - 15.9 g/dL   Hematocrit 40.9 81.1 - 46.6 %   MCV 91 79 - 97 fL   MCH 30.8 26.6 - 33.0 pg   MCHC 34.0 31.5 - 35.7 g/dL   RDW 91.4 (L) 78.2 - 95.6 %   Platelets 250 150 - 450 x10E3/uL   Neutrophils 53 Not Estab. %   Lymphs 34 Not Estab. %   Monocytes 10 Not Estab. %   Eos 2 Not Estab. %   Basos 1 Not Estab. %   Neutrophils Absolute 3.4 1.4 - 7.0 x10E3/uL   Lymphocytes Absolute 2.2 0.7 - 3.1 x10E3/uL   Monocytes Absolute 0.6 0.1 - 0.9 x10E3/uL   EOS (ABSOLUTE) 0.1 0.0 - 0.4 x10E3/uL   Basophils Absolute 0.0 0.0 - 0.2 x10E3/uL   Immature Granulocytes 0 Not Estab. %   Immature Grans (Abs) 0.0 0.0 - 0.1 x10E3/uL      Assessment & Plan:   Problem List Items Addressed This Visit  None    Visit Diagnoses    Upper respiratory tract infection, unspecified type    -  Primary   Tx with augmentin, viscous lidocaine, tessalon perles. Humidifier, mucinex, and other supportive care reviewed. Return precautions given       Follow up plan: Return if symptoms worsen or fail to improve.

## 2018-10-10 NOTE — Patient Instructions (Signed)
Follow up as needed

## 2018-11-02 ENCOUNTER — Other Ambulatory Visit: Payer: Self-pay | Admitting: Student

## 2018-11-02 DIAGNOSIS — K7689 Other specified diseases of liver: Secondary | ICD-10-CM

## 2018-11-08 ENCOUNTER — Encounter: Payer: Self-pay | Admitting: Family Medicine

## 2018-12-09 ENCOUNTER — Ambulatory Visit: Payer: BC Managed Care – PPO | Admitting: Family Medicine

## 2018-12-20 ENCOUNTER — Ambulatory Visit
Admission: RE | Admit: 2018-12-20 | Discharge: 2018-12-20 | Disposition: A | Discharge status: Home / Self Care | Payer: BC Managed Care – PPO | Source: Ambulatory Visit | Attending: Student | Admitting: Student

## 2018-12-20 DIAGNOSIS — K7689 Other specified diseases of liver: Secondary | ICD-10-CM | POA: Insufficient documentation

## 2018-12-27 ENCOUNTER — Other Ambulatory Visit: Payer: Self-pay

## 2018-12-27 ENCOUNTER — Ambulatory Visit: Payer: BC Managed Care – PPO | Admitting: Family Medicine

## 2018-12-27 ENCOUNTER — Encounter: Payer: Self-pay | Admitting: Family Medicine

## 2018-12-27 VITALS — BP 117/79 | HR 70 | Temp 99.0°F

## 2018-12-27 DIAGNOSIS — I10 Essential (primary) hypertension: Secondary | ICD-10-CM

## 2018-12-27 DIAGNOSIS — E785 Hyperlipidemia, unspecified: Secondary | ICD-10-CM | POA: Diagnosis not present

## 2018-12-27 DIAGNOSIS — K7689 Other specified diseases of liver: Secondary | ICD-10-CM | POA: Diagnosis not present

## 2018-12-27 MED ORDER — BENZONATATE 200 MG PO CAPS: 200.0000 mg | ORAL_CAPSULE | Freq: Two times a day (BID) | ORAL | 1 refills | Status: DC | PRN

## 2018-12-27 NOTE — Assessment & Plan Note (Signed)
The current medical regimen is effective;  continue present plan and medications.  

## 2018-12-27 NOTE — Assessment & Plan Note (Signed)
Followed by GI

## 2018-12-27 NOTE — Progress Notes (Signed)
BP 117/79   Pulse 70   Temp 99 F (37.2 C) (Oral)   LMP 04/30/2015 (Exact Date)   SpO2 96%    Subjective:    Patient ID: Kimberly Yang, female    DOB: 08-Jul-1961, 58 y.o.   MRN: [id number]  HPI: Kimberly Yang is a 58 y.o. female  Chief Complaint  Patient presents with  . Hypertension    5114m f/u  . Hyperlipidemia  Patient follow-up hypertension hypercholesterol doing well no complaints Also concerned about hepatic cyst just had ultrasound 2 days ago is wondering about the report patient with no's signs or symptoms. Gastroenterology and the last 2 echo reports showing the cysts grown in size somewhat but are still benign patient will follow-up with GI to further evaluate  Relevant past medical, surgical, family and social history reviewed and updated as indicated. Interim medical history since our last visit reviewed. Allergies and medications reviewed and updated.  Review of Systems  Constitutional: Negative.   Respiratory: Negative.   Cardiovascular: Negative.     Per HPI unless specifically indicated above     Objective:    BP 117/79   Pulse 70   Temp 99 F (37.2 C) (Oral)   LMP 04/30/2015 (Exact Date)   SpO2 96%   Wt Readings from Last 3 Encounters:  10/07/18 164 lb (74.4 kg)  08/04/18 164 lb 6.4 oz (74.6 kg)  06/15/18 163 lb (73.9 kg)    Physical Exam Constitutional:      Appearance: She is well-developed.  HENT:     Head: Normocephalic and atraumatic.  Eyes:     Conjunctiva/sclera: Conjunctivae normal.  Neck:     Musculoskeletal: Normal range of motion.  Cardiovascular:     Rate and Rhythm: Normal rate and regular rhythm.     Heart sounds: Normal heart sounds.  Pulmonary:     Effort: Pulmonary effort is normal.     Breath sounds: Normal breath sounds.  Musculoskeletal: Normal range of motion.  Skin:    Findings: No erythema.  Neurological:     Mental Status: She is alert and oriented to person, place, and time.  Psychiatric:        Behavior:  Behavior normal.        Thought Content: Thought content normal.        Judgment: Judgment normal.     Results for orders placed or performed in visit on 05/27/18  CBC with Differential/Platelet  Result Value Ref Range   WBC 6.4 3.4 - 10.8 x10E3/uL   RBC 4.15 3.77 - 5.28 x10E6/uL   Hemoglobin 12.8 11.1 - 15.9 g/dL   Hematocrit 40.937.6 81.134.0 - 46.6 %   MCV 91 79 - 97 fL   MCH 30.8 26.6 - 33.0 pg   MCHC 34.0 31.5 - 35.7 g/dL   RDW 91.412.0 (L) 78.212.3 - 95.615.4 %   Platelets 250 150 - 450 x10E3/uL   Neutrophils 53 Not Estab. %   Lymphs 34 Not Estab. %   Monocytes 10 Not Estab. %   Eos 2 Not Estab. %   Basos 1 Not Estab. %   Neutrophils Absolute 3.4 1.4 - 7.0 x10E3/uL   Lymphocytes Absolute 2.2 0.7 - 3.1 x10E3/uL   Monocytes Absolute 0.6 0.1 - 0.9 x10E3/uL   EOS (ABSOLUTE) 0.1 0.0 - 0.4 x10E3/uL   Basophils Absolute 0.0 0.0 - 0.2 x10E3/uL   Immature Granulocytes 0 Not Estab. %   Immature Grans (Abs) 0.0 0.0 - 0.1 x10E3/uL  Assessment & Plan:   Problem List Items Addressed This Visit      Cardiovascular and Mediastinum   Hypertension - Primary    The current medical regimen is effective;  continue present plan and medications.       Relevant Orders   Comprehensive metabolic panel     Digestive   Liver cyst    Followed by GI        Other   Hyperlipidemia    The current medical regimen is effective;  continue present plan and medications.       Relevant Orders   LP+ALT+AST [place], Waived   Comprehensive metabolic panel       Follow up plan: Return in about 6 months (around 06/27/2019) for Physical Exam.

## 2018-12-28 ENCOUNTER — Encounter: Payer: Self-pay | Admitting: Family Medicine

## 2018-12-28 LAB — COMPREHENSIVE METABOLIC PANEL
A/G RATIO: 2.4 — AB (ref 1.2–2.2)
ALT: 18 IU/L (ref 0–32)
AST: 23 IU/L (ref 0–40)
Albumin: 4.4 g/dL (ref 3.8–4.9)
Alkaline Phosphatase: 87 IU/L (ref 39–117)
BUN/Creatinine Ratio: 20 (ref 9–23)
BUN: 16 mg/dL (ref 6–24)
Bilirubin Total: <0.2 mg/dL (ref 0.0–1.2)
CO2: 22 mmol/L (ref 20–29)
Calcium: 9.2 mg/dL (ref 8.7–10.2)
Chloride: 103 mmol/L (ref 96–106)
Creatinine, Ser: 0.81 mg/dL (ref 0.57–1.00)
GFR calc Af Amer: 92 mL/min/{1.73_m2} (ref >59)
GFR calc non Af Amer: 80 mL/min/{1.73_m2} (ref >59)
Globulin, Total: 1.8 g/dL (ref 1.5–4.5)
Glucose: 92 mg/dL (ref 65–99)
POTASSIUM: 4.2 mmol/L (ref 3.5–5.2)
Sodium: 141 mmol/L (ref 134–144)
Total Protein: 6.2 g/dL (ref 6.0–8.5)

## 2018-12-28 LAB — LP+ALT+AST PICCOLO, WAIVED
ALT (SGPT) Piccolo, Waived: 24 U/L (ref 10–47)
AST (SGOT) Piccolo, Waived: 27 U/L (ref 11–38)
CHOL/HDL RATIO PICCOLO,WAIVE: 2.4 mg/dL
CHOLESTEROL PICCOLO, WAIVED: 137 mg/dL (ref <200)
HDL Chol Piccolo, Waived: 58 mg/dL (ref >59)
LDL CHOL CALC PICCOLO WAIVED: 59 mg/dL (ref <100)
Triglycerides Piccolo,Waived: 102 mg/dL (ref <150)
VLDL Chol Calc Piccolo,Waive: 20 mg/dL (ref <30)

## 2019-01-04 ENCOUNTER — Encounter: Payer: Self-pay | Admitting: Nurse Practitioner

## 2019-01-04 ENCOUNTER — Ambulatory Visit: Payer: BC Managed Care – PPO | Admitting: Nurse Practitioner

## 2019-01-04 VITALS — BP 120/80 | HR 71 | Temp 98.0°F | Ht 64.0 in | Wt 156.4 lb

## 2019-01-04 DIAGNOSIS — J0100 Acute maxillary sinusitis, unspecified: Secondary | ICD-10-CM

## 2019-01-04 DIAGNOSIS — R509 Fever, unspecified: Secondary | ICD-10-CM

## 2019-01-04 LAB — VERITOR FLU A/B WAIVED
Influenza A: NEGATIVE
Influenza B: NEGATIVE

## 2019-01-04 MED ORDER — OSELTAMIVIR PHOSPHATE 75 MG PO CAPS: 75.0000 mg | ORAL_CAPSULE | Freq: Two times a day (BID) | ORAL | 0 refills | Status: AC

## 2019-01-04 MED ORDER — AMOXICILLIN-POT CLAVULANATE 875-125 MG PO TABS: 1.0000 | ORAL_TABLET | Freq: Two times a day (BID) | ORAL | 0 refills | Status: AC

## 2019-01-04 NOTE — Assessment & Plan Note (Signed)
Flu testing negative, will treat with Tamiflu due to recent exposure to grandchild with positive testing.  Maxillary sinus pain present.  Augmentin script sent in.  Recommend continued use of Flonase and antihistamine medication at home.  Increased hydration and rest.  Return for worsening or continued symptoms.

## 2019-01-04 NOTE — Progress Notes (Signed)
BP 120/80 (BP Location: Left Arm, Patient Position: Sitting, Cuff Size: Normal)   Pulse 71   Temp 98 F (36.7 C)   Ht  (1.626 m)   Wt 156 lb 6 oz (70.9 kg)   LMP 04/30/2015 (Exact Date)   SpO2 95%   BMI 26.84 kg/m    Subjective:    Patient ID: Kimberly Yang, female    DOB: 01/22/1961, 58 y.o.   MRN: [id number]  HPI: Kimberly Yang is a 58 y.o. female  Chief Complaint  Patient presents with  . URI    Since Saturday, grandchildren tested positive for the flu   UPPER RESPIRATORY TRACT INFECTION Started with symptoms Friday.  Has on grandchild with the flu she has been around every day.  Last treated for sinus infection in November.  Uses Flonase daily at home.  Worst symptom: headache Fever: yes on Saturday and Sunday Cough: yes Shortness of breath: no Wheezing: no Chest pain: no Chest tightness: no Chest congestion: no Nasal congestion: yes Runny nose: yes Post nasal drip: yes Sneezing: yes Sore throat: yes Swollen glands: no Sinus pressure: no Headache: yes Face pain: no Toothache: no Ear pain: none Ear pressure: none Eyes red/itching:no Eye drainage/crusting: no  Vomiting: no Rash: no Fatigue: yes Sick contacts: yes Strep contacts: no  Context: fluctuating Recurrent sinusitis: no Relief with OTC cold/cough medications: no  Treatments attempted: mucinex   Relevant past medical, surgical, family and social history reviewed and updated as indicated. Interim medical history since our last visit reviewed. Allergies and medications reviewed and updated.  Review of Systems  Constitutional: Positive for fatigue and fever. Negative for activity change and appetite change.  HENT: Positive for congestion, postnasal drip, rhinorrhea, sneezing and sore throat. Negative for ear discharge, ear pain, facial swelling, sinus pressure, sinus pain and voice change.   Eyes: Negative for pain and visual disturbance.  Respiratory: Positive for cough. Negative for chest  tightness, shortness of breath and wheezing.   Cardiovascular: Negative for chest pain, palpitations and leg swelling.  Gastrointestinal: Negative for abdominal distention, abdominal pain, constipation, diarrhea, nausea and vomiting.  Endocrine: Negative.   Musculoskeletal: Negative for myalgias.  Neurological: Negative for dizziness, numbness and headaches.  Psychiatric/Behavioral: Negative.     Per HPI unless specifically indicated above     Objective:    BP 120/80 (BP Location: Left Arm, Patient Position: Sitting, Cuff Size: Normal)   Pulse 71   Temp 98 F (36.7 C)   Ht  (1.626 m)   Wt 156 lb 6 oz (70.9 kg)   LMP 04/30/2015 (Exact Date)   SpO2 95%   BMI 26.84 kg/m   Wt Readings from Last 3 Encounters:  01/04/19 156 lb 6 oz (70.9 kg)  10/07/18 164 lb (74.4 kg)  08/04/18 164 lb 6.4 oz (74.6 kg)    Physical Exam Vitals signs and nursing note reviewed.  Constitutional:      General: She is awake.     Appearance: She is well-developed.  HENT:     Head: Normocephalic. No raccoon eyes.     Right Ear: Hearing, ear canal and external ear normal. No drainage. A middle ear effusion is present.     Left Ear: Hearing, ear canal and external ear normal. No drainage. A middle ear effusion is present.     Nose: Mucosal edema and rhinorrhea present. Rhinorrhea is purulent.     Right Sinus: Maxillary sinus tenderness present. No frontal sinus tenderness.  Left Sinus: Maxillary sinus tenderness present. No frontal sinus tenderness.     Mouth/Throat:     Mouth: Mucous membranes are moist.     Pharynx: Posterior oropharyngeal erythema (mild with cobblestoning) present. No pharyngeal swelling or oropharyngeal exudate.  Eyes:     General: Lids are normal.        Right eye: No discharge.        Left eye: No discharge.     Conjunctiva/sclera: Conjunctivae normal.     Pupils: Pupils are equal, round, and reactive to light.  Neck:     Musculoskeletal: Normal range of motion and  neck supple.     Thyroid: No thyromegaly.     Vascular: No carotid bruit or JVD.  Cardiovascular:     Rate and Rhythm: Normal rate and regular rhythm.     Heart sounds: Normal heart sounds. No murmur. No gallop.   Pulmonary:     Effort: Pulmonary effort is normal.     Breath sounds: Normal breath sounds.     Comments: Clear throughout Abdominal:     General: Bowel sounds are normal.     Palpations: Abdomen is soft. There is no hepatomegaly or splenomegaly.  Musculoskeletal:     Right lower leg: No edema.     Left lower leg: No edema.  Lymphadenopathy:     Head:     Right side of head: No submental, submandibular, tonsillar or preauricular adenopathy.     Left side of head: No submental, submandibular, tonsillar or preauricular adenopathy.     Cervical: No cervical adenopathy.  Skin:    General: Skin is warm and dry.  Neurological:     Mental Status: She is alert and oriented to person, place, and time.  Psychiatric:        Attention and Perception: Attention normal.        Mood and Affect: Mood normal.        Behavior: Behavior normal. Behavior is cooperative.        Thought Content: Thought content normal.        Judgment: Judgment normal.     Results for orders placed or performed in visit on 12/27/18  LP+ALT+AST Piccolo, Waived  Result Value Ref Range   ALT (SGPT) Piccolo, Waived 24 10 - 47 U/L   AST (SGOT) Piccolo, Waived 27 11 - 38 U/L   Cholesterol Piccolo, Waived 137 <200 mg/dL   HDL Chol Piccolo, Waived 58 >59 mg/dL   Triglycerides Piccolo,Waived 102 <150 mg/dL   Chol/HDL Ratio Piccolo,Waive 2.4 mg/dL   LDL Chol Calc Piccolo Waived 59 <100 mg/dL   VLDL Chol Calc Piccolo,Waive 20 <30 mg/dL  Comprehensive metabolic panel  Result Value Ref Range   Glucose 92 65 - 99 mg/dL   BUN 16 6 - 24 mg/dL   Creatinine, Ser 9.60 0.57 - 1.00 mg/dL   GFR calc non Af Amer 80 >59 mL/min/1.73   GFR calc Af Amer 92 >59 mL/min/1.73   BUN/Creatinine Ratio 20 9 - 23   Sodium 141  134 - 144 mmol/L   Potassium 4.2 3.5 - 5.2 mmol/L   Chloride 103 96 - 106 mmol/L   CO2 22 20 - 29 mmol/L   Calcium 9.2 8.7 - 10.2 mg/dL   Total Protein 6.2 6.0 - 8.5 g/dL   Albumin 4.4 3.8 - 4.9 g/dL   Globulin, Total 1.8 1.5 - 4.5 g/dL   Albumin/Globulin Ratio 2.4 (H) 1.2 - 2.2   Bilirubin Total <0.2 0.0 - 1.2  mg/dL   Alkaline Phosphatase 87 39 - 117 IU/L   AST 23 0 - 40 IU/L   ALT 18 0 - 32 IU/L      Assessment & Plan:   Problem List Items Addressed This Visit      Respiratory   Sinusitis, acute, maxillary - Primary    Flu testing negative, will treat with Tamiflu due to recent exposure to grandchild with positive testing.  Maxillary sinus pain present.  Augmentin script sent in.  Recommend continued use of Flonase and antihistamine medication at home.  Increased hydration and rest.  Return for worsening or continued symptoms.      Relevant Medications   amoxicillin-clavulanate (AUGMENTIN) 875-125 MG tablet   oseltamivir (TAMIFLU) 75 MG capsule       Follow up plan: Return if symptoms worsen or fail to improve.

## 2019-01-04 NOTE — Patient Instructions (Signed)

## 2019-01-10 ENCOUNTER — Other Ambulatory Visit: Payer: Self-pay | Admitting: Family Medicine

## 2019-01-10 MED ORDER — DOXYCYCLINE HYCLATE 100 MG PO TABS: 100.0000 mg | ORAL_TABLET | Freq: Two times a day (BID) | ORAL | 0 refills | Status: DC

## 2019-03-05 ENCOUNTER — Encounter: Payer: Self-pay | Admitting: Family Medicine

## 2019-03-07 NOTE — Telephone Encounter (Signed)
-----   Message from Steele Sizer, MD sent at 03/07/2019  9:00 AM EDT ----- Regarding: note No problem Kimberly Yang, I agree with you stay out of work at least until May 1.  If we need longer at that time we will do so.  We can mail you a letter are you can come by and pick it up.  The office will fill 1 out for you.Mark ===View-only below this line===   ----- Message -----    From: Kimberly Yang    Sent: 03/05/2019  1:18 PM EDT      To: Kimberly Moss, MD Subject: RE: Non-Urgent Medical Question  Hi, I hope you are doing well. I need a doctors note to continue to stay home from my job. at 58 years old I do not feel comfortable working, but the only way i can keep my insurance is with help from you.  I have to be classified as high risk. With my reoccurring sinus infections and liver problems I am just trying to stay safe. As you Know [name] is 23 and diabetic and i would hate to expose him.  I am also keeping grandchildren so there single mother can work,  but that doesn't count because to qualify it has to be your children you are providing child care for. I am having trouble sleeping because i am afraid to go back to work and as you know i have that problem when things are great. Please help if you can. Thank you [name]

## 2019-03-09 NOTE — Telephone Encounter (Signed)
Patient would like a note stating that she needs to be out until the middle of May, is this ok to write?

## 2019-03-10 ENCOUNTER — Telehealth: Payer: Self-pay

## 2019-03-10 NOTE — Telephone Encounter (Signed)
Called patient to let her know that her paperwork not for work is ready to be picked up. Pt stated she will come by to the office today to pick it up.

## 2019-03-27 ENCOUNTER — Encounter: Payer: Self-pay | Admitting: Family Medicine

## 2019-03-28 NOTE — Telephone Encounter (Signed)
Error

## 2019-06-29 ENCOUNTER — Ambulatory Visit (INDEPENDENT_AMBULATORY_CARE_PROVIDER_SITE_OTHER): Payer: BC Managed Care – PPO | Admitting: Family Medicine

## 2019-06-29 ENCOUNTER — Other Ambulatory Visit: Payer: Self-pay

## 2019-06-29 ENCOUNTER — Encounter: Payer: Self-pay | Admitting: Family Medicine

## 2019-06-29 DIAGNOSIS — E785 Hyperlipidemia, unspecified: Secondary | ICD-10-CM

## 2019-06-29 DIAGNOSIS — I10 Essential (primary) hypertension: Secondary | ICD-10-CM | POA: Diagnosis not present

## 2019-06-29 DIAGNOSIS — G4700 Insomnia, unspecified: Secondary | ICD-10-CM | POA: Diagnosis not present

## 2019-06-29 DIAGNOSIS — Z0000 Encounter for general adult medical examination without abnormal findings: Secondary | ICD-10-CM

## 2019-06-29 MED ORDER — BISOPROLOL-HYDROCHLOROTHIAZIDE 5-6.25 MG PO TABS: 1.0000 | ORAL_TABLET | Freq: Every day | ORAL | 4 refills | Status: DC

## 2019-06-29 MED ORDER — FLUTICASONE PROPIONATE 50 MCG/ACT NA SUSP: 1.0000 | Freq: Two times a day (BID) | NASAL | 6 refills | Status: DC

## 2019-06-29 MED ORDER — ATORVASTATIN CALCIUM 20 MG PO TABS: 20.0000 mg | ORAL_TABLET | Freq: Every day | ORAL | 4 refills | Status: DC

## 2019-06-29 MED ORDER — BENZONATATE 200 MG PO CAPS: 200.0000 mg | ORAL_CAPSULE | Freq: Two times a day (BID) | ORAL | 1 refills | Status: DC | PRN

## 2019-06-29 NOTE — Assessment & Plan Note (Signed)
The current medical regimen is effective;  continue present plan and medications.  

## 2019-06-29 NOTE — Progress Notes (Signed)
LMP 04/30/2015 (Exact Date)    Subjective:    Patient ID: Kimberly Yang, female    DOB: 10-10-1961, 58 y.o.   MRN: [id number]  HPI: Kimberly Yang is a 58 y.o. female  Med check Discussed with patient all in all doing well no complaints is concerned about going back to school working in [employer] and doing work around multiple other workers with her other comorbidities.  I agreed with patient will fill out paperwork to stay out of work. Needs refill on Tessalon Perles when occasionally gets a slight cough that seems to help will give refills. Blood pressure cholesterol doing well with no complaints. Rare use of trazodone for sleep and does help.    Relevant past medical, surgical, family and social history reviewed and updated as indicated. Interim medical history since our last visit reviewed. Allergies and medications reviewed and updated.  Review of Systems  Constitutional: Negative.   HENT: Negative.   Eyes: Negative.   Respiratory: Negative.   Cardiovascular: Negative.   Gastrointestinal: Negative.   Endocrine: Negative.   Genitourinary: Negative.   Musculoskeletal: Negative.   Skin: Negative.   Allergic/Immunologic: Negative.   Neurological: Negative.   Hematological: Negative.   Psychiatric/Behavioral: Negative.     Per HPI unless specifically indicated above     Objective:    LMP 04/30/2015 (Exact Date)   Wt Readings from Last 3 Encounters:  01/04/19 156 lb 6 oz (70.9 kg)  10/07/18 164 lb (74.4 kg)  08/04/18 164 lb 6.4 oz (74.6 kg)    Physical Exam  Results for orders placed or performed in visit on 01/04/19  Veritor Flu A/B Waived  Result Value Ref Range   Influenza A Negative Negative   Influenza B Negative Negative      Assessment & Plan:   Problem List Items Addressed This Visit      Cardiovascular and Mediastinum   Hypertension    The current medical regimen is effective;  continue present plan and medications.       Relevant  Medications   bisoprolol-hydrochlorothiazide (ZIAC) 5-6.25 MG tablet   atorvastatin (LIPITOR) 20 MG tablet     Other   Hyperlipidemia    The current medical regimen is effective;  continue present plan and medications.       Relevant Medications   bisoprolol-hydrochlorothiazide (ZIAC) 5-6.25 MG tablet   atorvastatin (LIPITOR) 20 MG tablet   Insomnia    The current medical regimen is effective;  continue present plan and medications.          Telemedicine using audio/video telecommunications for a synchronous communication visit. Today's visit due to COVID-19 isolation precautions I connected with and verified that I am speaking with the correct person using two identifiers.   I discussed the limitations, risks, security and privacy concerns of performing an evaluation and management service by telecommunication and the availability of in person appointments. I also discussed with the patient that there may be a patient responsible charge related to this service. The patient expressed understanding and agreed to proceed. The patient's location is car. I am at home.   I discussed the assessment and treatment plan with the patient. The patient was provided an opportunity to ask questions and all were answered. The patient agreed with the plan and demonstrated an understanding of the instructions.   The patient was advised to call back or seek an in-person evaluation if the symptoms worsen or if the condition fails to improve as anticipated.  I provided 21+ minutes of time during this encounter. Follow up plan: Return for Physical Exam, soon with 6 months follow-up, BMP,  Lipids, ALT, AST.

## 2019-07-04 ENCOUNTER — Other Ambulatory Visit: Payer: Self-pay | Admitting: Family Medicine

## 2019-07-04 ENCOUNTER — Encounter: Payer: Self-pay | Admitting: Family Medicine

## 2019-07-04 DIAGNOSIS — Z1231 Encounter for screening mammogram for malignant neoplasm of breast: Secondary | ICD-10-CM

## 2019-07-10 ENCOUNTER — Encounter: Payer: Self-pay | Admitting: Family Medicine

## 2019-07-13 ENCOUNTER — Other Ambulatory Visit: Payer: Self-pay

## 2019-07-13 ENCOUNTER — Ambulatory Visit
Admission: RE | Admit: 2019-07-13 | Discharge: 2019-07-13 | Disposition: A | Discharge status: Home / Self Care | Payer: BC Managed Care – PPO | Source: Ambulatory Visit | Attending: Family Medicine | Admitting: Family Medicine

## 2019-07-13 DIAGNOSIS — Z1231 Encounter for screening mammogram for malignant neoplasm of breast: Secondary | ICD-10-CM

## 2019-07-25 ENCOUNTER — Other Ambulatory Visit: Payer: Self-pay | Admitting: Student

## 2019-07-25 DIAGNOSIS — K7689 Other specified diseases of liver: Secondary | ICD-10-CM

## 2019-08-19 ENCOUNTER — Ambulatory Visit: Payer: BC Managed Care – PPO

## 2019-08-23 ENCOUNTER — Encounter: Payer: Self-pay | Admitting: Nurse Practitioner

## 2019-08-26 ENCOUNTER — Other Ambulatory Visit: Payer: Self-pay

## 2019-08-26 ENCOUNTER — Encounter: Payer: Self-pay | Admitting: Nurse Practitioner

## 2019-08-26 ENCOUNTER — Ambulatory Visit (INDEPENDENT_AMBULATORY_CARE_PROVIDER_SITE_OTHER): Payer: BC Managed Care – PPO | Admitting: Nurse Practitioner

## 2019-08-26 VITALS — BP 126/83 | HR 61 | Temp 98.1°F | Ht 62.5 in | Wt 169.0 lb

## 2019-08-26 DIAGNOSIS — K7689 Other specified diseases of liver: Secondary | ICD-10-CM

## 2019-08-26 DIAGNOSIS — E785 Hyperlipidemia, unspecified: Secondary | ICD-10-CM

## 2019-08-26 DIAGNOSIS — I10 Essential (primary) hypertension: Secondary | ICD-10-CM

## 2019-08-26 DIAGNOSIS — Z0000 Encounter for general adult medical examination without abnormal findings: Secondary | ICD-10-CM | POA: Diagnosis not present

## 2019-08-26 DIAGNOSIS — Z23 Encounter for immunization: Secondary | ICD-10-CM

## 2019-08-26 LAB — URINALYSIS, ROUTINE W REFLEX MICROSCOPIC
Bilirubin, UA: NEGATIVE
Glucose, UA: NEGATIVE
Ketones, UA: NEGATIVE
Leukocytes,UA: NEGATIVE
Nitrite, UA: NEGATIVE
Protein,UA: NEGATIVE
RBC, UA: NEGATIVE
Specific Gravity, UA: 1.01 (ref 1.005–1.030)
Urobilinogen, Ur: 0.2 mg/dL (ref 0.2–1.0)
pH, UA: 5.5 (ref 5.0–7.5)

## 2019-08-26 MED ORDER — SHINGRIX 50 MCG/0.5ML IM SUSR: 0.5000 mL | Freq: Once | INTRAMUSCULAR | 0 refills | Status: AC

## 2019-08-26 NOTE — Patient Instructions (Signed)
Arthritis Arthritis means joint pain. It can also mean joint disease. A joint is a place where bones come together. There are more than 100 types of arthritis. What are the causes? This condition may be caused by:  Wear and tear of a joint. This is the most common cause.  A lot of acid in the blood, which leads to pain in the joint (gout).  Pain and swelling (inflammation) in a joint.  Infection of a joint.  Injuries in the joint.  A reaction to medicines (allergy). In some cases, the cause may not be known. What are the signs or symptoms? Symptoms of this condition include:  Redness at a joint.  Swelling at a joint.  Stiffness at a joint.  Warmth coming from the joint.  A fever.  A feeling of being sick. How is this treated? This condition may be treated with:  Treating the cause, if it is known.  Rest.  Raising (elevating) the joint.  Putting cold or hot packs on the joint.  Medicines to treat symptoms and reduce pain and swelling.  Shots of medicines (cortisone) into the joint. You may also be told to make changes in your life, such as doing exercises and losing weight. Follow these instructions at home: Medicines  Take over-the-counter and prescription medicines only as told by your doctor.  Do not take aspirin for pain if your doctor says that you may have gout. Activity  Rest your joint if your doctor tells you to.  Avoid activities that make the pain worse.  Exercise your joint regularly as told by your doctor. Try doing exercises like: ? Swimming. ? Water aerobics. ? Biking. ? Walking. Managing pain, stiffness, and swelling      If told, put ice on the affected area. ? Put ice in a plastic bag. ? Place a towel between your skin and the bag. ? Leave the ice on for 20 minutes, 2-3 times per day.  If your joint is swollen, raise (elevate) it above the level of your heart if told by your doctor.  If your joint feels stiff in the morning,  try taking a warm shower.  If told, put heat on the affected area. Do this as often as told by your doctor. Use the heat source that your doctor recommends, such as a moist heat pack or a heating pad. If you have diabetes, do not apply heat without asking your doctor. To apply heat: ? Place a towel between your skin and the heat source. ? Leave the heat on for 20-30 minutes. ? Remove the heat if your skin turns bright red. This is very important if you are unable to feel pain, heat, or cold. You may have a greater risk of getting burned. General instructions  Do not use any products that contain nicotine or tobacco, such as cigarettes, e-cigarettes, and chewing tobacco. If you need help quitting, ask your doctor.  Keep all follow-up visits as told by your doctor. This is important. Contact a doctor if:  The pain gets worse.  You have a fever. Get help right away if:  You have very bad pain in your joint.  You have swelling in your joint.  Your joint is red.  Many joints become painful and swollen.  You have very bad back pain.  Your leg is very weak.  You cannot control your pee (urine) or poop (stool). Summary  Arthritis means joint pain. It can also mean joint disease. A joint is a place   where bones come together.  The most common cause of this condition is wear and tear of a joint.  Symptoms of this condition include redness, swelling, or stiffness of the joint.  This condition is treated with rest, raising the joint, medicines, and putting cold or hot packs on the joint.  Follow your doctor's instructions about medicines, activity, exercises, and other home care treatments. This information is not intended to replace advice given to you by your health care provider. Make sure you discuss any questions you have with your health care provider. Document Released: 02/11/2010 Document Revised: 10/25/2018 Document Reviewed: 10/25/2018 Elsevier Patient Education  2020  [employer]  

## 2019-08-26 NOTE — Assessment & Plan Note (Signed)
Chronic, stable with BP below goal.  Continue current medication regimen and adjust as needed. Labs today.    

## 2019-08-26 NOTE — Progress Notes (Signed)
BP 126/83   Pulse 61   Temp 98.1 F (36.7 C) (Oral)   Ht 5' 2.5" (1.588 m)   Wt 169 lb (76.7 kg)   LMP 04/30/2015 (Exact Date)   SpO2 98%   BMI 30.42 kg/m    Subjective:    Patient ID: Kimberly Yang, female    DOB: 1961-05-09, 58 y.o.   MRN: [id number]  HPI: Kimberly Yang is a 58 y.o. female presenting on 08/26/2019 for comprehensive medical examination. Current medical complaints include:none  She currently lives with: significant other Menopausal Symptoms: yes , hot flashes for years and does not want medication  HYPERTENSION / HYPERLIPIDEMIA Satisfied with current treatment? yes Duration of hypertension: chronic BP monitoring frequency: not checking BP range:  BP medication side effects: no Duration of hyperlipidemia: chronic Cholesterol medication side effects: no Cholesterol supplements: none Medication compliance: good compliance Aspirin: yes Recent stressors: no Recurrent headaches: no Visual changes: no Palpitations: no Dyspnea: no Chest pain: no Lower extremity edema: no Dizzy/lightheaded: no  Depression Screen done today and results listed below:  Depression screen [facility] 2/9 08/26/2019 06/15/2018 05/20/2018 12/28/2017 04/17/2017  Decreased Interest 0 0 0 0 0  Down, Depressed, Hopeless 0 0 0 0 0  PHQ - 2 Score 0 0 0 0 0  Altered sleeping 0 - - - 0  Tired, decreased energy 0 - - - 0  Change in appetite 0 - - - 0  Feeling bad or failure about yourself  0 - - - 0  Trouble concentrating 0 - - - 0  Moving slowly or fidgety/restless 0 - - - 0  Suicidal thoughts 0 - - - 0  PHQ-9 Score 0 - - - 0  Difficult doing work/chores Not difficult at all - - - -    The patient does not have a history of falls. I did not complete a risk assessment for falls. A plan of care for falls was not documented.   Past Medical History:  Past Medical History:  Diagnosis Date  . Allergic rhinitis   . Headache   . Hyperlipidemia   . Sinusitis   . Vaginitis     Surgical History:   Past Surgical History:  Procedure Laterality Date  . ABDOMINAL HYSTERECTOMY    . COLONOSCOPY    . CYSTOSCOPY N/A 05/21/2015   Procedure: CYSTOSCOPY;  Surgeon: Christeen Douglas, MD;  Location: [facility];  Service: Gynecology;  Laterality: N/A;  . HYSTEROSCOPY W/D&C N/A 04/20/2015   Procedure: DILATATION AND CURETTAGE /HYSTEROSCOPY;  Surgeon: Christeen Douglas, MD;  Location: [facility];  Service: Gynecology;  Laterality: N/A;  . LAPAROSCOPIC ASSISTED VAGINAL HYSTERECTOMY N/A 05/21/2015   Procedure: LAPAROSCOPIC ASSISTED VAGINAL HYSTERECTOMY;  Surgeon: Christeen Douglas, MD;  Location: [facility];  Service: Gynecology;  Laterality: N/A;  . LAPAROSCOPIC SALPINGO OOPHERECTOMY Bilateral 05/21/2015   Procedure: LAPAROSCOPIC SALPINGO OOPHORECTOMY;  Surgeon: Christeen Douglas, MD;  Location: [facility];  Service: Gynecology;  Laterality: Bilateral;  . NASAL SINUS SURGERY      Medications:  Current Outpatient Medications on File Prior to Visit  Medication Sig  . aspirin EC 81 MG tablet Take 81 mg by mouth daily.  Marland Kitchen atorvastatin (LIPITOR) 20 MG tablet Take 1 tablet (20 mg total) by mouth at bedtime.  . benzonatate (TESSALON) 200 MG capsule Take 1 capsule (200 mg total) by mouth 2 (two) times daily as needed.  . bisoprolol-hydrochlorothiazide (ZIAC) 5-6.25 MG tablet Take 1 tablet by mouth daily.  . cholecalciferol (VITAMIN D) 1000 UNITS tablet Take  1,000 Units by mouth daily.  . fluticasone (FLONASE) 50 MCG/ACT nasal spray Place 1 spray into both nostrils 2 (two) times daily.  . traZODone (DESYREL) 50 MG tablet Take 0.5-1 tablets (25-50 mg total) by mouth at bedtime as needed for sleep.   No current facility-administered medications on file prior to visit.     Allergies:  No Known Allergies  Social History:  Social History   Socioeconomic History  . Marital status: Divorced    Spouse name: Not on file  . Number of children: Not on file  . Years of education: Not on file  . Highest education level: Not  on file  Occupational History  . Not on file  Social Needs  . Financial resource strain: Not on file  . Food insecurity    Worry: Not on file    Inability: Not on file  . Transportation needs    Medical: Not on file    Non-medical: Not on file  Tobacco Use  . Smoking status: Never Smoker  . Smokeless tobacco: Never Used  Substance and Sexual Activity  . Alcohol use: No  . Drug use: No  . Sexual activity: Yes    Birth control/protection: Pill  Lifestyle  . Physical activity    Days per week: Not on file    Minutes per session: Not on file  . Stress: Not on file  Relationships  . Social Herbalist on phone: Not on file    Gets together: Not on file    Attends religious service: Not on file    Active member of club or organization: Not on file    Attends meetings of clubs or organizations: Not on file    Relationship status: Not on file  . Intimate partner violence    Fear of current or ex partner: Not on file    Emotionally abused: Not on file    Physically abused: Not on file    Forced sexual activity: Not on file  Other Topics Concern  . Not on file  Social History Narrative  . Not on file   Social History   Tobacco Use  Smoking Status Never Smoker  Smokeless Tobacco Never Used   Social History   Substance and Sexual Activity  Alcohol Use No    Family History:  Family History  Problem Relation Age of Onset  . Breast cancer Neg Hx     Past medical history, surgical history, medications, allergies, family history and social history reviewed with patient today and changes made to appropriate areas of the chart.   Review of Systems - negative All other ROS negative except what is listed above and in the HPI.      Objective:    BP 126/83   Pulse 61   Temp 98.1 F (36.7 C) (Oral)   Ht 5' 2.5" (1.588 m)   Wt 169 lb (76.7 kg)   LMP 04/30/2015 (Exact Date)   SpO2 98%   BMI 30.42 kg/m   Wt Readings from Last 3 Encounters:  08/26/19 169 lb  (76.7 kg)  01/04/19 156 lb 6 oz (70.9 kg)  10/07/18 164 lb (74.4 kg)    Physical Exam Constitutional:      General: She is awake. She is not in acute distress.    Appearance: She is well-developed. She is not ill-appearing.  HENT:     Head: Normocephalic and atraumatic.     Right Ear: Hearing, tympanic membrane, ear canal and external ear  normal. No drainage.     Left Ear: Hearing, tympanic membrane, ear canal and external ear normal. No drainage.     Nose: Nose normal.     Right Sinus: No maxillary sinus tenderness or frontal sinus tenderness.     Left Sinus: No maxillary sinus tenderness or frontal sinus tenderness.     Mouth/Throat:     Mouth: Mucous membranes are moist.     Pharynx: Oropharynx is clear. Uvula midline. No pharyngeal swelling, oropharyngeal exudate or posterior oropharyngeal erythema.  Eyes:     General: Lids are normal.        Right eye: No discharge.        Left eye: No discharge.     Extraocular Movements: Extraocular movements intact.     Conjunctiva/sclera: Conjunctivae normal.     Pupils: Pupils are equal, round, and reactive to light.     Visual Fields: Right eye visual fields normal and left eye visual fields normal.  Neck:     Musculoskeletal: Normal range of motion and neck supple.     Thyroid: No thyromegaly.     Vascular: No carotid bruit.     Trachea: Trachea normal.  Cardiovascular:     Rate and Rhythm: Normal rate and regular rhythm.     Heart sounds: Normal heart sounds. No murmur. No gallop.   Pulmonary:     Effort: Pulmonary effort is normal. No accessory muscle usage or respiratory distress.     Breath sounds: Normal breath sounds.  Chest:     Comments: Deferred per patient request due to recent mammogram. Abdominal:     General: Bowel sounds are normal.     Palpations: Abdomen is soft. There is no hepatomegaly or splenomegaly.     Tenderness: There is no abdominal tenderness.  Musculoskeletal: Normal range of motion.     Right lower  leg: No edema.     Left lower leg: No edema.  Lymphadenopathy:     Head:     Right side of head: No submental, submandibular, tonsillar, preauricular or posterior auricular adenopathy.     Left side of head: No submental, submandibular, tonsillar, preauricular or posterior auricular adenopathy.     Cervical: No cervical adenopathy.  Skin:    General: Skin is warm and dry.     Capillary Refill: Capillary refill takes less than 2 seconds.     Findings: No rash.  Neurological:     Mental Status: She is alert and oriented to person, place, and time.     Cranial Nerves: Cranial nerves are intact.     Gait: Gait is intact.     Deep Tendon Reflexes: Reflexes are normal and symmetric.     Reflex Scores:      Brachioradialis reflexes are 2+ on the right side and 2+ on the left side.      Patellar reflexes are 2+ on the right side and 2+ on the left side. Psychiatric:        Attention and Perception: Attention normal.        Mood and Affect: Mood normal.        Speech: Speech normal.        Behavior: Behavior normal. Behavior is cooperative.        Thought Content: Thought content normal.        Judgment: Judgment normal.     Results for orders placed or performed in visit on 01/04/19  Veritor Flu A/B Waived  Result Value Ref Range   Influenza  A Negative Negative   Influenza B Negative Negative      Assessment & Plan:   Problem List Items Addressed This Visit      Cardiovascular and Mediastinum   Hypertension    Chronic, stable with BP below goal.  Continue current medication regimen and adjust as needed.  Labs today.          Digestive   Liver cyst    Followed by GI, obtain CMP today and send to GI office.        Other   Hyperlipidemia    Chronic, ongoing.  Continue current medication regimen and adjust as needed.  Labs today.       Other Visit Diagnoses    Flu vaccine need    -  Primary   Relevant Orders   Flu Vaccine QUAD 36+ mos IM (Completed)   PE (physical  exam), annual           Follow up plan: Return in about 6 months (around 02/23/2020) for HTN/HLD.   LABORATORY TESTING:  - Pap smear: not applicable  IMMUNIZATIONS:   - Tdap: Tetanus vaccination status reviewed: last tetanus booster within 10 years. - Influenza: Up to date - Pneumovax: Not applicable - Prevnar: Not applicable - HPV: Not applicable - Zostavax vaccine: ordered  SCREENING: -Mammogram: Up to date  - Colonoscopy: Up to date  - Bone Density: Not applicable  -Hearing Test: Not applicable  -Spirometry: Not applicable   PATIENT COUNSELING:   Advised to take 1 mg of folate supplement per day if capable of pregnancy.   Sexuality: Discussed sexually transmitted diseases, partner selection, use of condoms, avoidance of unintended pregnancy  and contraceptive alternatives.   Advised to avoid cigarette smoking.  I discussed with the patient that most people either abstain from alcohol or drink within safe limits (<=14/week and <=4 drinks/occasion for males, <=7/weeks and <= 3 drinks/occasion for females) and that the risk for alcohol disorders and other health effects rises proportionally with the number of drinks per week and how often a drinker exceeds daily limits.  Discussed cessation/primary prevention of drug use and availability of treatment for abuse.   Diet: Encouraged to adjust caloric intake to maintain  or achieve ideal body weight, to reduce intake of dietary saturated fat and total fat, to limit sodium intake by avoiding high sodium foods and not adding table salt, and to maintain adequate dietary potassium and calcium preferably from fresh fruits, vegetables, and low-fat dairy products.    stressed the importance of regular exercise  Injury prevention: Discussed safety belts, safety helmets, smoke detector, smoking near bedding or upholstery.   Dental health: Discussed importance of regular tooth brushing, flossing, and dental visits.    NEXT PREVENTATIVE  PHYSICAL DUE IN 1 YEAR. Return in about 6 months (around 02/23/2020) for HTN/HLD.

## 2019-08-26 NOTE — Assessment & Plan Note (Signed)
Followed by GI, obtain CMP today and send to GI office.

## 2019-08-26 NOTE — Assessment & Plan Note (Signed)
Chronic, ongoing.  Continue current medication regimen and adjust as needed.  Labs today. 

## 2019-08-27 LAB — CBC WITH DIFFERENTIAL/PLATELET
Basophils Absolute: 0.1 10*3/uL (ref 0.0–0.2)
Basos: 1 %
EOS (ABSOLUTE): 0.1 10*3/uL (ref 0.0–0.4)
Eos: 2 %
Hematocrit: 39.6 % (ref 34.0–46.6)
Hemoglobin: 13.5 g/dL (ref 11.1–15.9)
Immature Grans (Abs): 0 10*3/uL (ref 0.0–0.1)
Immature Granulocytes: 0 %
Lymphocytes Absolute: 2.2 10*3/uL (ref 0.7–3.1)
Lymphs: 34 %
MCH: 31.3 pg (ref 26.6–33.0)
MCHC: 34.1 g/dL (ref 31.5–35.7)
MCV: 92 fL (ref 79–97)
Monocytes Absolute: 0.6 10*3/uL (ref 0.1–0.9)
Monocytes: 10 %
Neutrophils Absolute: 3.4 10*3/uL (ref 1.4–7.0)
Neutrophils: 53 %
Platelets: 247 10*3/uL (ref 150–450)
RBC: 4.31 x10E6/uL (ref 3.77–5.28)
RDW: 12 % (ref 11.7–15.4)
WBC: 6.4 10*3/uL (ref 3.4–10.8)

## 2019-08-27 LAB — COMPREHENSIVE METABOLIC PANEL
ALT: 21 IU/L (ref 0–32)
AST: 27 IU/L (ref 0–40)
Albumin/Globulin Ratio: 1.8 (ref 1.2–2.2)
Albumin: 4.2 g/dL (ref 3.8–4.9)
Alkaline Phosphatase: 91 IU/L (ref 39–117)
BUN/Creatinine Ratio: 15 (ref 12–28)
BUN: 14 mg/dL (ref 8–27)
Bilirubin Total: 0.3 mg/dL (ref 0.0–1.2)
CO2: 25 mmol/L (ref 20–29)
Calcium: 9.2 mg/dL (ref 8.7–10.3)
Chloride: 100 mmol/L (ref 96–106)
Creatinine, Ser: 0.94 mg/dL (ref 0.57–1.00)
GFR calc Af Amer: 76 mL/min/{1.73_m2} (ref >59)
GFR calc non Af Amer: 66 mL/min/{1.73_m2} (ref >59)
Globulin, Total: 2.4 g/dL (ref 1.5–4.5)
Glucose: 98 mg/dL (ref 65–99)
Potassium: 4.8 mmol/L (ref 3.5–5.2)
Sodium: 137 mmol/L (ref 134–144)
Total Protein: 6.6 g/dL (ref 6.0–8.5)

## 2019-08-27 LAB — LIPID PANEL
Chol/HDL Ratio: 2.3 ratio (ref 0.0–4.4)
Cholesterol, Total: 138 mg/dL (ref 100–199)
HDL: 60 mg/dL (ref >39)
LDL Chol Calc (NIH): 60 mg/dL (ref 0–99)
Triglycerides: 96 mg/dL (ref 0–149)
VLDL Cholesterol Cal: 18 mg/dL (ref 5–40)

## 2019-08-27 LAB — TSH: TSH: 2.1 u[IU]/mL (ref 0.450–4.500)

## 2019-08-29 ENCOUNTER — Encounter: Payer: Self-pay | Admitting: Family Medicine

## 2019-11-17 ENCOUNTER — Telehealth: Payer: BC Managed Care – PPO | Admitting: Physician Assistant

## 2019-11-17 DIAGNOSIS — J0191 Acute recurrent sinusitis, unspecified: Secondary | ICD-10-CM

## 2019-11-17 MED ORDER — CEFDINIR 300 MG PO CAPS: 300.0000 mg | ORAL_CAPSULE | Freq: Two times a day (BID) | ORAL | 0 refills | Status: DC

## 2019-11-17 NOTE — Progress Notes (Signed)
We are sorry that you are not feeling well.  Here is how we plan to help!  Based on what you have shared with me it looks like you have sinusitis.  Sinusitis is inflammation and infection in the sinus cavities of the head.  Based on your presentation I believe you most likely have Acute Bacterial Sinusitis.  This is an infection caused by bacteria and is treated with antibiotics. I have prescribed Omnicef 300 mg one tablet twice daily for 10 days, which is the medication you have taken before.  You may use an oral decongestant such as Mucinex D or if you have glaucoma or high blood pressure use plain Mucinex. Saline nasal spray help and can safely be used as often as needed for congestion.  If you develop worsening sinus pain, fever or notice severe headache and vision changes, or if symptoms are not better after completion of antibiotic, please schedule an appointment with a health care provider.    Sinus infections are not as easily transmitted as other respiratory infection, however we still recommend that you avoid close contact with loved ones, especially the very young and elderly.  Remember to wash your hands thoroughly throughout the day as this is the number one way to prevent the spread of infection!  Home Care:  Only take medications as instructed by your medical team.  Complete the entire course of an antibiotic.  Do not take these medications with alcohol.  A steam or ultrasonic humidifier can help congestion.  You can place a towel over your head and breathe in the steam from hot water coming from a faucet.  Avoid close contacts especially the very young and the elderly.  Cover your mouth when you cough or sneeze.  Always remember to wash your hands.  Get Help Right Away If:  You develop worsening fever or sinus pain.  You develop a severe head ache or visual changes.  Your symptoms persist after you have completed your treatment plan.  Make sure you  Understand these  instructions.  Will watch your condition.  Will get help right away if you are not doing well or get worse.  Your e-visit answers were reviewed by a board certified advanced clinical practitioner to complete your personal care plan.  Depending on the condition, your plan could have included both over the counter or prescription medications.  If there is a problem please reply  once you have received a response from your provider.  Your safety is important to [place].  If you have drug allergies check your prescription carefully.    You can use MyChart to ask questions about today's visit, request a non-urgent call back, or ask for a work or school excuse for 24 hours related to this e-Visit. If it has been greater than 24 hours you will need to follow up with your provider, or enter a new e-Visit to address those concerns.  You will get an e-mail in the next two days asking about your experience.  I hope that your e-visit has been valuable and will speed your recovery. Thank you for using e-visits.  Particia Nearing PA-C  Approximately 5 minutes was spent documenting and reviewing patient's chart.

## 2019-11-18 ENCOUNTER — Ambulatory Visit: Payer: BC Managed Care – PPO | Admitting: Family Medicine

## 2019-11-22 ENCOUNTER — Other Ambulatory Visit: Payer: Self-pay

## 2019-11-22 ENCOUNTER — Ambulatory Visit
Admission: RE | Admit: 2019-11-22 | Discharge: 2019-11-22 | Disposition: A | Discharge status: Home / Self Care | Payer: BC Managed Care – PPO | Source: Ambulatory Visit | Attending: Student | Admitting: Student

## 2019-11-22 DIAGNOSIS — K7689 Other specified diseases of liver: Secondary | ICD-10-CM

## 2019-11-22 LAB — POCT I-STAT CREATININE: Creatinine, Ser: 0.9 mg/dL (ref 0.44–1.00)

## 2019-11-22 MED ORDER — GADOBUTROL 1 MMOL/ML IV SOLN
7.0000 mL | Freq: Once | INTRAVENOUS | Status: AC | PRN
  Administered 2019-11-22: 17:00:00 7 mL via INTRAVENOUS

## 2019-11-27 ENCOUNTER — Encounter: Payer: Self-pay | Admitting: Family Medicine

## 2019-11-29 ENCOUNTER — Other Ambulatory Visit: Payer: Self-pay | Admitting: Nurse Practitioner

## 2019-11-29 DIAGNOSIS — R918 Other nonspecific abnormal finding of lung field: Secondary | ICD-10-CM

## 2019-11-29 NOTE — Progress Notes (Signed)
Lung CT to follow up on abnormal findings with GI [facility].

## 2019-12-01 NOTE — Addendum Note (Signed)
Addended by: Marnee Guarneri T on: 12/01/2019 10:18 AM   Modules accepted: Orders

## 2019-12-12 ENCOUNTER — Other Ambulatory Visit: Payer: Self-pay

## 2019-12-12 ENCOUNTER — Ambulatory Visit
Admission: RE | Admit: 2019-12-12 | Discharge: 2019-12-12 | Disposition: A | Discharge status: Home / Self Care | Payer: BC Managed Care – PPO | Source: Ambulatory Visit | Attending: Nurse Practitioner | Admitting: Nurse Practitioner

## 2019-12-12 DIAGNOSIS — R918 Other nonspecific abnormal finding of lung field: Secondary | ICD-10-CM | POA: Insufficient documentation

## 2020-01-03 ENCOUNTER — Ambulatory Visit: Payer: Self-pay | Admitting: Family Medicine

## 2020-02-04 ENCOUNTER — Ambulatory Visit: Payer: BC Managed Care – PPO | Attending: Internal Medicine

## 2020-02-04 ENCOUNTER — Other Ambulatory Visit: Payer: Self-pay

## 2020-02-04 DIAGNOSIS — Z23 Encounter for immunization: Secondary | ICD-10-CM | POA: Insufficient documentation

## 2020-02-04 NOTE — Progress Notes (Signed)
   Covid-19 Vaccination Clinic  Name:  JUNNIE LOSCHIAVO    MRN: [id number] DOB: 08-29-1961  02/04/2020  Ms. Thull was observed post Covid-19 immunization for 15 minutes without incident. She was provided with Vaccine Information Sheet and instruction to access the V-Safe system.   Ms. Lienemann was instructed to call 911 with any severe reactions post vaccine: Marland Kitchen Difficulty breathing  . Swelling of face and throat  . A fast heartbeat  . A bad rash all over body  . Dizziness and weakness   Immunizations Administered    Name Date Dose VIS Date Route   Moderna COVID-19 Vaccine 02/04/2020  1:36 PM 0.5 mL 11/01/2019 Intramuscular   Manufacturer: Moderna   Lot: [id number]   NDC: 17356-701-41

## 2020-03-03 ENCOUNTER — Ambulatory Visit: Payer: BC Managed Care – PPO | Attending: Internal Medicine

## 2020-03-03 DIAGNOSIS — Z23 Encounter for immunization: Secondary | ICD-10-CM

## 2020-03-03 NOTE — Progress Notes (Signed)
   Covid-19 Vaccination Clinic  Name:  Kimberly Yang    MRN: [id number] DOB: 1960/12/13  03/03/2020  [name] was observed post Covid-19 immunization for 15 minutes without incident. She was provided with Vaccine Information Sheet and instruction to access the V-Safe system.   [name] was instructed to call 911 with any severe reactions post vaccine: Marland Kitchen Difficulty breathing  . Swelling of face and throat  . A fast heartbeat  . A bad rash all over body  . Dizziness and weakness   Immunizations Administered    Name Date Dose VIS Date Route   Moderna COVID-19 Vaccine 03/03/2020  9:33 AM 0.5 mL 11/01/2019 Intramuscular   Manufacturer: Gala Murdoch   Lot: [id number]   NDC: 09050-256-15

## 2020-06-12 ENCOUNTER — Ambulatory Visit (INDEPENDENT_AMBULATORY_CARE_PROVIDER_SITE_OTHER): Payer: BC Managed Care – PPO | Admitting: Family Medicine

## 2020-06-12 ENCOUNTER — Encounter: Payer: Self-pay | Admitting: Family Medicine

## 2020-06-12 ENCOUNTER — Other Ambulatory Visit: Payer: Self-pay

## 2020-06-12 VITALS — BP 125/83 | HR 59 | Temp 98.2°F | Wt 172.0 lb

## 2020-06-12 DIAGNOSIS — Z1231 Encounter for screening mammogram for malignant neoplasm of breast: Secondary | ICD-10-CM

## 2020-06-12 DIAGNOSIS — E785 Hyperlipidemia, unspecified: Secondary | ICD-10-CM

## 2020-06-12 DIAGNOSIS — J3089 Other allergic rhinitis: Secondary | ICD-10-CM | POA: Diagnosis not present

## 2020-06-12 DIAGNOSIS — J309 Allergic rhinitis, unspecified: Secondary | ICD-10-CM | POA: Insufficient documentation

## 2020-06-12 DIAGNOSIS — I10 Essential (primary) hypertension: Secondary | ICD-10-CM

## 2020-06-12 MED ORDER — BENZONATATE 200 MG PO CAPS: 200.0000 mg | ORAL_CAPSULE | Freq: Two times a day (BID) | ORAL | 1 refills | Status: DC | PRN

## 2020-06-12 MED ORDER — MONTELUKAST SODIUM 10 MG PO TABS: 10.0000 mg | ORAL_TABLET | Freq: Every day | ORAL | 3 refills | Status: DC

## 2020-06-12 MED ORDER — BISOPROLOL-HYDROCHLOROTHIAZIDE 5-6.25 MG PO TABS: 1.0000 | ORAL_TABLET | Freq: Every day | ORAL | 1 refills | Status: DC

## 2020-06-12 MED ORDER — ATORVASTATIN CALCIUM 20 MG PO TABS: 20.0000 mg | ORAL_TABLET | Freq: Every day | ORAL | 1 refills | Status: DC

## 2020-06-12 NOTE — Assessment & Plan Note (Signed)
Stable, continue current regimen and recheck lipids. Healthy lifestyle habits reviewed

## 2020-06-12 NOTE — Assessment & Plan Note (Signed)
Stable and overall well controlled, add singulair at bedtime to current regimen to keep flares under control. Refill tessalon for prn use

## 2020-06-12 NOTE — Progress Notes (Signed)
BP 125/83   Pulse (!) 59   Temp 98.2 F (36.8 C) (Oral)   Wt 172 lb (78 kg)   LMP 04/30/2015 (Exact Date)   SpO2 95%   BMI 30.96 kg/m    Subjective:    Patient ID: Kimberly Yang, female    DOB: 12/01/1961, 59 y.o.   MRN: [id number]  HPI: Kimberly Yang is a 59 y.o. female  Chief Complaint  Patient presents with  . Nasal Congestion    x a week   Presenting today for 6 month f/u chronic conditions.   HTN - Taking medication faithfully without side effects. Not checking home BPs consistently. Denies CP, SOB, HAs, dizziness. Trying to eat healthy and stay active.   HLD - on lipitor, denies myalgias, claudication, CP, SOB.   Allergies - on OTC antihistamines and flonase, overall feeling under good control but sometimes still dealing with congestion and hacking cough here and there. Has always kept tessalon perles around for these times with good relief and would like a refill. Denies fever, chills, CP, SOB, wheezing with flares.   Only taking the trazodone 2-3 nights per week, alternates with ibuprofen PM for insomnia. Tends to cut trazodone tabs in half with good results.   Relevant past medical, surgical, family and social history reviewed and updated as indicated. Interim medical history since our last visit reviewed. Allergies and medications reviewed and updated.  Review of Systems  Per HPI unless specifically indicated above     Objective:    BP 125/83   Pulse (!) 59   Temp 98.2 F (36.8 C) (Oral)   Wt 172 lb (78 kg)   LMP 04/30/2015 (Exact Date)   SpO2 95%   BMI 30.96 kg/m   Wt Readings from Last 3 Encounters:  06/12/20 172 lb (78 kg)  08/26/19 169 lb (76.7 kg)  01/04/19 156 lb 6 oz (70.9 kg)    Physical Exam Vitals and nursing note reviewed.  Constitutional:      Appearance: Normal appearance. She is not ill-appearing.  HENT:     Head: Atraumatic.     Nose: Nose normal. No congestion.     Mouth/Throat:     Mouth: Mucous membranes are moist.      Pharynx: Oropharynx is clear. No posterior oropharyngeal erythema.  Eyes:     Extraocular Movements: Extraocular movements intact.     Conjunctiva/sclera: Conjunctivae normal.  Cardiovascular:     Rate and Rhythm: Normal rate and regular rhythm.     Heart sounds: Normal heart sounds.  Pulmonary:     Effort: Pulmonary effort is normal.     Breath sounds: Normal breath sounds.  Abdominal:     General: Bowel sounds are normal.     Palpations: Abdomen is soft.     Tenderness: There is no abdominal tenderness.  Musculoskeletal:        General: Normal range of motion.     Cervical back: Normal range of motion and neck supple.  Skin:    General: Skin is warm and dry.  Neurological:     Mental Status: She is alert and oriented to person, place, and time.  Psychiatric:        Mood and Affect: Mood normal.        Thought Content: Thought content normal.        Judgment: Judgment normal.     Results for orders placed or performed during the hospital encounter of 11/22/19  I-STAT creatinine  Result Value Ref  Range   Creatinine, Ser 0.90 0.44 - 1.00 mg/dL      Assessment & Plan:   Problem List Items Addressed This Visit      Cardiovascular and Mediastinum   Hypertension    BPs stable and well controlled, continue current regimen      Relevant Medications   atorvastatin (LIPITOR) 20 MG tablet   bisoprolol-hydrochlorothiazide (ZIAC) 5-6.25 MG tablet   Other Relevant Orders   Comprehensive metabolic panel     Respiratory   Allergic rhinitis    Stable and overall well controlled, add singulair at bedtime to current regimen to keep flares under control. Refill tessalon for prn use        Other   Hyperlipidemia - Primary    Stable, continue current regimen and recheck lipids. Healthy lifestyle habits reviewed      Relevant Medications   atorvastatin (LIPITOR) 20 MG tablet   bisoprolol-hydrochlorothiazide (ZIAC) 5-6.25 MG tablet   Other Relevant Orders   Lipid Panel w/o  Chol/HDL Ratio    Other Visit Diagnoses    Encounter for screening mammogram for malignant neoplasm of breast       Relevant Orders   MM DIGITAL SCREENING BILATERAL       Follow up plan: Return in about 6 months (around 12/13/2020) for 6 month f/u.

## 2020-06-12 NOTE — Assessment & Plan Note (Signed)
BPs stable and well controlled, continue current regimen 

## 2020-06-13 LAB — COMPREHENSIVE METABOLIC PANEL
ALT: 18 IU/L (ref 0–32)
AST: 23 IU/L (ref 0–40)
Albumin/Globulin Ratio: 2 (ref 1.2–2.2)
Albumin: 4.3 g/dL (ref 3.8–4.8)
Alkaline Phosphatase: 100 IU/L (ref 48–121)
BUN/Creatinine Ratio: 19 (ref 12–28)
BUN: 15 mg/dL (ref 8–27)
Bilirubin Total: 0.3 mg/dL (ref 0.0–1.2)
CO2: 23 mmol/L (ref 20–29)
Calcium: 9.2 mg/dL (ref 8.7–10.3)
Chloride: 101 mmol/L (ref 96–106)
Creatinine, Ser: 0.79 mg/dL (ref 0.57–1.00)
GFR calc Af Amer: 93 mL/min/{1.73_m2} (ref >59)
GFR calc non Af Amer: 81 mL/min/{1.73_m2} (ref >59)
Globulin, Total: 2.2 g/dL (ref 1.5–4.5)
Glucose: 97 mg/dL (ref 65–99)
Potassium: 4.2 mmol/L (ref 3.5–5.2)
Sodium: 139 mmol/L (ref 134–144)
Total Protein: 6.5 g/dL (ref 6.0–8.5)

## 2020-06-13 LAB — LIPID PANEL W/O CHOL/HDL RATIO
Cholesterol, Total: 149 mg/dL (ref 100–199)
HDL: 63 mg/dL (ref >39)
LDL Chol Calc (NIH): 73 mg/dL (ref 0–99)
Triglycerides: 62 mg/dL (ref 0–149)
VLDL Cholesterol Cal: 13 mg/dL (ref 5–40)

## 2020-07-16 ENCOUNTER — Encounter: Payer: Self-pay | Admitting: Family Medicine

## 2020-08-20 ENCOUNTER — Other Ambulatory Visit: Payer: BC Managed Care – PPO

## 2020-08-20 ENCOUNTER — Encounter: Payer: Self-pay | Admitting: Family Medicine

## 2020-08-20 ENCOUNTER — Telehealth (INDEPENDENT_AMBULATORY_CARE_PROVIDER_SITE_OTHER): Payer: BC Managed Care – PPO | Admitting: Family Medicine

## 2020-08-20 ENCOUNTER — Other Ambulatory Visit: Payer: Self-pay

## 2020-08-20 VITALS — Temp 98.0°F | Wt 168.0 lb

## 2020-08-20 DIAGNOSIS — Z20822 Contact with and (suspected) exposure to covid-19: Secondary | ICD-10-CM | POA: Diagnosis not present

## 2020-08-20 MED ORDER — METHYLPREDNISOLONE 4 MG PO TBPK
ORAL_TABLET | Freq: Every day | ORAL | 0 refills | Status: DC
Start: 2020-08-20 — End: 2020-12-14

## 2020-08-20 NOTE — Progress Notes (Signed)
Temp 98 F (36.7 C) (Temporal)   Wt 168 lb (76.2 kg)   LMP 04/30/2015 (Exact Date)   BMI 30.24 kg/m    Subjective:    Patient ID: Kimberly Yang, female    DOB: 03-Oct-1961, 59 y.o.   MRN: [id number]  HPI: Kimberly Yang is a 59 y.o. female  Chief Complaint  Patient presents with  . Sinusitis    facial pressure x about 5 days. has tried OTC mucinex  . Headache   UPPER RESPIRATORY TRACT INFECTION- has not gotten tested Duration: 4-5 days Worst symptom: congestion Fever: no Cough: yes Shortness of breath: no Wheezing: no Chest pain: no Chest tightness: no Chest congestion: no Nasal congestion: yes Runny nose: no Post nasal drip: yes Sneezing: no Sore throat: yes Swollen glands: no Sinus pressure: yes Headache: yes Face pain: yes Toothache: yes Ear pain: no  Ear pressure: no  Eyes red/itching:no Eye drainage/crusting: no  Vomiting: no Rash: no Fatigue: yes Sick contacts: no Strep contacts: no  Context: stable Recurrent sinusitis: no Relief with OTC cold/cough medications: no  Treatments attempted: cold/sinus, mucinex and anti-histamine   Relevant past medical, surgical, family and social history reviewed and updated as indicated. Interim medical history since our last visit reviewed. Allergies and medications reviewed and updated.  Review of Systems  Constitutional: Positive for fatigue. Negative for activity change, appetite change, chills, diaphoresis, fever and unexpected weight change.  HENT: Positive for congestion, postnasal drip, rhinorrhea and sinus pressure. Negative for dental problem, drooling, ear discharge, ear pain, facial swelling, hearing loss, mouth sores, nosebleeds, sinus pain, sneezing, sore throat, tinnitus, trouble swallowing and voice change.   Eyes: Negative.   Respiratory: Negative.   Cardiovascular: Negative.   Gastrointestinal: Negative.   Neurological: Negative.   Psychiatric/Behavioral: Negative.     Per HPI unless  specifically indicated above     Objective:    Temp 98 F (36.7 C) (Temporal)   Wt 168 lb (76.2 kg)   LMP 04/30/2015 (Exact Date)   BMI 30.24 kg/m   Wt Readings from Last 3 Encounters:  08/20/20 168 lb (76.2 kg)  06/12/20 172 lb (78 kg)  08/26/19 169 lb (76.7 kg)    Physical Exam Vitals and nursing note reviewed.  Constitutional:      General: She is not in acute distress.    Appearance: Normal appearance. She is not ill-appearing, toxic-appearing or diaphoretic.  HENT:     Head: Normocephalic and atraumatic.     Right Ear: External ear normal.     Left Ear: External ear normal.     Nose: Nose normal.     Mouth/Throat:     Mouth: Mucous membranes are moist.     Pharynx: Oropharynx is clear.  Eyes:     General: No scleral icterus.       Right eye: No discharge.        Left eye: No discharge.     Conjunctiva/sclera: Conjunctivae normal.     Pupils: Pupils are equal, round, and reactive to light.  Pulmonary:     Effort: Pulmonary effort is normal. No respiratory distress.     Comments: Speaking in full sentences Musculoskeletal:        General: Normal range of motion.     Cervical back: Normal range of motion.  Skin:    Coloration: Skin is not jaundiced or pale.     Findings: No bruising, erythema, lesion or rash.  Neurological:     Mental Status: She is alert  and oriented to person, place, and time. Mental status is at baseline.  Psychiatric:        Mood and Affect: Mood normal.        Behavior: Behavior normal.        Thought Content: Thought content normal.        Judgment: Judgment normal.     Results for orders placed or performed in visit on 06/12/20  Comprehensive metabolic panel  Result Value Ref Range   Glucose 97 65 - 99 mg/dL   BUN 15 8 - 27 mg/dL   Creatinine, Ser 4.96 0.57 - 1.00 mg/dL   GFR calc non Af Amer 81 >59 mL/min/1.73   GFR calc Af Amer 93 >59 mL/min/1.73   BUN/Creatinine Ratio 19 12 - 28   Sodium 139 134 - 144 mmol/L   Potassium 4.2  3.5 - 5.2 mmol/L   Chloride 101 96 - 106 mmol/L   CO2 23 20 - 29 mmol/L   Calcium 9.2 8.7 - 10.3 mg/dL   Total Protein 6.5 6.0 - 8.5 g/dL   Albumin 4.3 3.8 - 4.8 g/dL   Globulin, Total 2.2 1.5 - 4.5 g/dL   Albumin/Globulin Ratio 2.0 1.2 - 2.2   Bilirubin Total 0.3 0.0 - 1.2 mg/dL   Alkaline Phosphatase 100 48 - 121 IU/L   AST 23 0 - 40 IU/L   ALT 18 0 - 32 IU/L  Lipid Panel w/o Chol/HDL Ratio  Result Value Ref Range   Cholesterol, Total 149 100 - 199 mg/dL   Triglycerides 62 0 - 149 mg/dL   HDL 63 >75 mg/dL   VLDL Cholesterol Cal 13 5 - 40 mg/dL   LDL Chol Calc (NIH) 73 0 - 99 mg/dL      Assessment & Plan:   Problem List Items Addressed This Visit    None    Visit Diagnoses    Suspected COVID-19 virus infection    -  Primary   Will get swabbed. Self-quarantine until results are back. Medrol dose pack for comfort. If not better by Wednesday, consider abx.    Relevant Orders   Novel Coronavirus, NAA ([facility])       Follow up plan: Return if symptoms worsen or fail to improve.   . This visit was completed via MyChart due to the restrictions of the COVID-19 pandemic. All issues as above were discussed and addressed. Physical exam was done as above through visual confirmation on MyChart. If it was felt that the patient should be evaluated in the office, they were directed there. The patient verbally consented to this visit. . Location of the patient: home . Location of the provider: work . Those involved with this call:  . Provider: Olevia Perches, DO . CMA: Elton Sin, CMA . Front Desk/Registration: Harriet Pho  . Time spent on call: 15 minutes with patient face to face via video conference. More than 50% of this time was spent in counseling and coordination of care. 23 minutes total spent in review of patient's record and preparation of their chart.

## 2020-08-22 ENCOUNTER — Telehealth: Payer: Self-pay | Admitting: Family Medicine

## 2020-08-22 NOTE — Telephone Encounter (Signed)
Copied from CRM [id number]. Topic: Quick Communication - Rx Refill/Question >> Aug 22, 2020 10:53 AM Randol Kern wrote: Pt was prescribed methylPREDNISolone (MEDROL DOSEPAK) 4 MG TBPK tablet  and was told to call back if her condition had not improved. She has called back to report that she still does not feel any better, please advise.   Best contact: [phone]  [facility] - [place], [place] - [address] [place] [address] Phone: [phone] Fax: [phone]

## 2020-08-22 NOTE — Telephone Encounter (Signed)
Please check on covid swab status.

## 2020-08-22 NOTE — Telephone Encounter (Signed)
Routing to provider. Patient just started medication 2 days ago.

## 2020-08-23 ENCOUNTER — Encounter: Payer: Self-pay | Admitting: Family Medicine

## 2020-08-23 LAB — NOVEL CORONAVIRUS, NAA: SARS-CoV-2, NAA: NOT DETECTED

## 2020-08-23 LAB — SARS-COV-2, NAA 2 DAY TAT

## 2020-08-23 MED ORDER — DOXYCYCLINE HYCLATE 100 MG PO TABS: 100.0000 mg | ORAL_TABLET | Freq: Two times a day (BID) | ORAL | 0 refills | Status: DC

## 2020-08-23 NOTE — Telephone Encounter (Signed)
abx sent to her pharmacy

## 2020-08-23 NOTE — Telephone Encounter (Signed)
Pt has not heard back from covid test and she is requesting an antibiotic because she is still not feeling any better.  Facial pain and mucus cough.  [facility]  CB#  [phone]

## 2020-08-23 NOTE — Telephone Encounter (Signed)
Patient notified

## 2020-09-06 ENCOUNTER — Other Ambulatory Visit: Payer: Self-pay | Admitting: Family Medicine

## 2020-09-19 ENCOUNTER — Other Ambulatory Visit: Payer: Self-pay | Admitting: Nurse Practitioner

## 2020-10-01 ENCOUNTER — Other Ambulatory Visit: Payer: Self-pay | Admitting: Family Medicine

## 2020-10-01 DIAGNOSIS — Z1231 Encounter for screening mammogram for malignant neoplasm of breast: Secondary | ICD-10-CM

## 2020-10-29 ENCOUNTER — Other Ambulatory Visit: Payer: Self-pay

## 2020-10-29 ENCOUNTER — Ambulatory Visit
Admission: RE | Admit: 2020-10-29 | Discharge: 2020-10-29 | Disposition: A | Discharge status: Home / Self Care | Payer: BC Managed Care – PPO | Source: Ambulatory Visit | Attending: Family Medicine | Admitting: Family Medicine

## 2020-10-29 DIAGNOSIS — Z1231 Encounter for screening mammogram for malignant neoplasm of breast: Secondary | ICD-10-CM | POA: Insufficient documentation

## 2020-11-27 ENCOUNTER — Other Ambulatory Visit: Payer: Self-pay | Admitting: Family Medicine

## 2020-11-27 NOTE — Telephone Encounter (Signed)
Requested Prescriptions  Pending Prescriptions Disp Refills   fluticasone (FLONASE) 50 MCG/ACT nasal spray [Pharmacy Med Name: FLUTICASONE PROP 50 MCG SPRAY] 16 g 0    Sig: Place 1 spray into both nostrils 2 (two) times daily.     Ear, Nose, and Throat: Nasal Preparations - Corticosteroids Passed - 11/27/2020 10:02 AM      Passed - Valid encounter within last 12 months    Recent Outpatient Visits          3 months ago Suspected COVID-19 virus infection   [facility], Megan P, DO   5 months ago Hyperlipidemia, unspecified hyperlipidemia type   [facility], Salley Hews, [place]   1 year ago PE (physical exam), annual   [address] [facility], Dorie Rank, NP   1 year ago PE (physical exam), annual   [facility], Redge Gainer, MD   1 year ago Acute non-recurrent maxillary sinusitis   [facility] Marjie Skiff, NP      Future Appointments            In 2 weeks Laural Benes, Oralia Rud, DO [facility], [facility]

## 2020-12-14 ENCOUNTER — Telehealth (INDEPENDENT_AMBULATORY_CARE_PROVIDER_SITE_OTHER): Payer: BC Managed Care – PPO | Admitting: Family Medicine

## 2020-12-14 ENCOUNTER — Ambulatory Visit: Payer: BC Managed Care – PPO | Admitting: Family Medicine

## 2020-12-14 ENCOUNTER — Encounter: Payer: Self-pay | Admitting: Family Medicine

## 2020-12-14 DIAGNOSIS — E785 Hyperlipidemia, unspecified: Secondary | ICD-10-CM

## 2020-12-14 DIAGNOSIS — I10 Essential (primary) hypertension: Secondary | ICD-10-CM

## 2020-12-14 MED ORDER — BENZONATATE 100 MG PO CAPS: 100.0000 mg | ORAL_CAPSULE | Freq: Two times a day (BID) | ORAL | 0 refills | Status: DC | PRN

## 2020-12-14 MED ORDER — BISOPROLOL-HYDROCHLOROTHIAZIDE 5-6.25 MG PO TABS
1.0000 | ORAL_TABLET | Freq: Every day | ORAL | 1 refills | Status: DC
Start: 2020-12-14 — End: 2021-06-18

## 2020-12-14 MED ORDER — ATORVASTATIN CALCIUM 20 MG PO TABS: 20.0000 mg | ORAL_TABLET | Freq: Every day | ORAL | 1 refills | Status: DC

## 2020-12-14 NOTE — Assessment & Plan Note (Signed)
Under good control on current regimen. Continue current regimen. Continue to monitor. Call with any concerns. Refills given. Labs to be drawn next week. Orders in.

## 2020-12-14 NOTE — Progress Notes (Signed)
LMP 04/30/2015 (Exact Date)    Subjective:    Patient ID: Kimberly Yang, female    DOB: [birth year], 60 y.o.   MRN: [id number]  HPI: Kimberly Yang is a 60 y.o. female  Chief Complaint  Patient presents with  . Hyperlipidemia  . Hypertension   HYPERTENSION / HYPERLIPIDEMIA Satisfied with current treatment? yes Duration of hypertension: chronic BP monitoring frequency: occasionally BP range: 120/80 BP medication side effects: no Past BP meds:  Duration of hyperlipidemia: chronic Cholesterol medication side effects: no Cholesterol supplements: none Past cholesterol medications: atorvastatin Medication compliance: excellent compliance Aspirin: yes Recent stressors: no Recurrent headaches: no Visual changes: no Palpitations: no Dyspnea: no Chest pain: no Lower extremity edema: no Dizzy/lightheaded: no  Relevant past medical, surgical, family and social history reviewed and updated as indicated. Interim medical history since our last visit reviewed. Allergies and medications reviewed and updated.  Review of Systems  Constitutional: Negative.   HENT: Negative.   Respiratory: Negative.   Cardiovascular: Negative.   Gastrointestinal: Negative.   Musculoskeletal: Negative.   Psychiatric/Behavioral: Negative.     Per HPI unless specifically indicated above     Objective:    LMP 04/30/2015 (Exact Date)   Wt Readings from Last 3 Encounters:  08/20/20 168 lb (76.2 kg)  06/12/20 172 lb (78 kg)  08/26/19 169 lb (76.7 kg)    Physical Exam Vitals and nursing note reviewed.  Constitutional:      General: She is not in acute distress.    Appearance: Normal appearance. She is not ill-appearing, toxic-appearing or diaphoretic.  HENT:     Head: Normocephalic and atraumatic.     Right Ear: External ear normal.     Left Ear: External ear normal.     Nose: Nose normal.     Mouth/Throat:     Mouth: Mucous membranes are moist.     Pharynx: Oropharynx is clear.  Eyes:      General: No scleral icterus.       Right eye: No discharge.        Left eye: No discharge.     Conjunctiva/sclera: Conjunctivae normal.     Pupils: Pupils are equal, round, and reactive to light.  Pulmonary:     Effort: Pulmonary effort is normal. No respiratory distress.     Comments: Speaking in full sentences Musculoskeletal:        General: Normal range of motion.     Cervical back: Normal range of motion.  Skin:    Coloration: Skin is not jaundiced or pale.     Findings: No bruising, erythema, lesion or rash.  Neurological:     Mental Status: She is alert and oriented to person, place, and time. Mental status is at baseline.  Psychiatric:        Mood and Affect: Mood normal.        Behavior: Behavior normal.        Thought Content: Thought content normal.        Judgment: Judgment normal.     Results for orders placed or performed in visit on 08/20/20  Novel Coronavirus, NAA ([facility])   Specimen: Saline  Result Value Ref Range   SARS-CoV-2, NAA Not Detected Not Detected  SARS-COV-2, NAA 2 DAY TAT  Result Value Ref Range   SARS-CoV-2, NAA 2 DAY TAT Performed       Assessment & Plan:   Problem List Items Addressed This Visit      Cardiovascular and Mediastinum   Hypertension  Under good control on current regimen. Continue current regimen. Continue to monitor. Call with any concerns. Refills given. Labs to be drawn next week. Orders in.       Relevant Medications   atorvastatin (LIPITOR) 20 MG tablet   bisoprolol-hydrochlorothiazide (ZIAC) 5-6.25 MG tablet     Other   Hyperlipidemia    Under good control on current regimen. Continue current regimen. Continue to monitor. Call with any concerns. Refills given. Labs to be drawn next week. Orders in.       Relevant Medications   atorvastatin (LIPITOR) 20 MG tablet   bisoprolol-hydrochlorothiazide (ZIAC) 5-6.25 MG tablet   Other Relevant Orders   Lipid Panel w/o Chol/HDL Ratio   Comprehensive metabolic panel     Other Visit Diagnoses    Essential hypertension       Relevant Medications   atorvastatin (LIPITOR) 20 MG tablet   bisoprolol-hydrochlorothiazide (ZIAC) 5-6.25 MG tablet   Other Relevant Orders   Comprehensive metabolic panel       Follow up plan: Return in about 6 months (around 06/13/2021).   . This visit was completed via MyChart due to the restrictions of the COVID-19 pandemic. All issues as above were discussed and addressed. Physical exam was done as above through visual confirmation on MyChart. If it was felt that the patient should be evaluated in the office, they were directed there. The patient verbally consented to this visit. . Location of the patient: home . Location of the provider: home . Those involved with this call:  . Provider: Olevia Perches, DO . CMA: Wilhemena Durie, CMA . Front Desk/Registration: Harriet Pho  . Time spent on call: 25 minutes with patient face to face via video conference. More than 50% of this time was spent in counseling and coordination of care. 40 minutes total spent in review of patient's record and preparation of their chart.

## 2020-12-14 NOTE — Assessment & Plan Note (Signed)
Under good control on current regimen. Continue current regimen. Continue to monitor. Call with any concerns. Refills given. Labs to be drawn next week. Orders in.  

## 2021-02-04 ENCOUNTER — Other Ambulatory Visit: Payer: Self-pay | Admitting: Family Medicine

## 2021-04-08 ENCOUNTER — Other Ambulatory Visit: Payer: Self-pay | Admitting: Family Medicine

## 2021-05-07 ENCOUNTER — Ambulatory Visit: Payer: BC Managed Care – PPO | Admitting: Nurse Practitioner

## 2021-05-07 ENCOUNTER — Other Ambulatory Visit: Payer: Self-pay

## 2021-05-07 ENCOUNTER — Encounter: Payer: Self-pay | Admitting: Nurse Practitioner

## 2021-05-07 VITALS — BP 133/85 | HR 61 | Temp 98.9°F | Wt 160.0 lb

## 2021-05-07 DIAGNOSIS — R8281 Pyuria: Secondary | ICD-10-CM | POA: Diagnosis not present

## 2021-05-07 DIAGNOSIS — R300 Dysuria: Secondary | ICD-10-CM | POA: Insufficient documentation

## 2021-05-07 LAB — URINALYSIS, ROUTINE W REFLEX MICROSCOPIC
Bilirubin, UA: NEGATIVE
Glucose, UA: NEGATIVE
Ketones, UA: NEGATIVE
Nitrite, UA: NEGATIVE
Protein,UA: NEGATIVE
Specific Gravity, UA: <1.005 — ABNORMAL LOW (ref 1.005–1.030)
Urobilinogen, Ur: 0.2 mg/dL (ref 0.2–1.0)
pH, UA: 5.5 (ref 5.0–7.5)

## 2021-05-07 LAB — MICROSCOPIC EXAMINATION: Epithelial Cells (non renal): NONE SEEN /hpf (ref 0–10)

## 2021-05-07 LAB — WET PREP FOR TRICH, YEAST, CLUE
Clue Cell Exam: NEGATIVE
Trichomonas Exam: NEGATIVE
Yeast Exam: NEGATIVE

## 2021-05-07 NOTE — Patient Instructions (Signed)
Urinary Tract Infection, Adult A urinary tract infection (UTI) is an infection of any part of the urinary tract. The urinary tract includes:  The kidneys.  The ureters.  The bladder.  The urethra. These organs make, store, and get rid of pee (urine) in the body. What are the causes? This infection is caused by germs (bacteria) in your genital area. These germs grow and cause swelling (inflammation) of your urinary tract. What increases the risk? The following factors may make you more likely to develop this condition:  Using a small, thin tube (catheter) to drain pee.  Not being able to control when you pee or poop (incontinence).  Being female. If you are female, these things can increase the risk: ? Using these methods to prevent pregnancy:  A medicine that kills sperm (spermicide).  A device that blocks sperm (diaphragm). ? Having low levels of a female hormone (estrogen). ? Being pregnant. You are more likely to develop this condition if:  You have genes that add to your risk.  You are sexually active.  You take antibiotic medicines.  You have trouble peeing because of: ? A prostate that is bigger than normal, if you are female. ? A blockage in the part of your body that drains pee from the bladder. ? A kidney stone. ? A nerve condition that affects your bladder. ? Not getting enough to drink. ? Not peeing often enough.  You have other conditions, such as: ? Diabetes. ? A weak disease-fighting system (immune system). ? Sickle cell disease. ? Gout. ? Injury of the spine. What are the signs or symptoms? Symptoms of this condition include:  Needing to pee right away.  Peeing small amounts often.  Pain or burning when peeing.  Blood in the pee.  Pee that smells bad or not like normal.  Trouble peeing.  Pee that is cloudy.  Fluid coming from the vagina, if you are female.  Pain in the belly or lower back. Other symptoms include:  Vomiting.  Not  feeling hungry.  Feeling mixed up (confused). This may be the first symptom in older adults.  Being tired and grouchy (irritable).  A fever.  Watery poop (diarrhea). How is this treated?  Taking antibiotic medicine.  Taking other medicines.  Drinking enough water. In some cases, you may need to see a specialist. Follow these instructions at home: Medicines  Take over-the-counter and prescription medicines only as told by your doctor.  If you were prescribed an antibiotic medicine, take it as told by your doctor. Do not stop taking it even if you start to feel better. General instructions  Make sure you: ? Pee until your bladder is empty. ? Do not hold pee for a long time. ? Empty your bladder after sex. ? Wipe from front to back after peeing or pooping if you are a female. Use each tissue one time when you wipe.  Drink enough fluid to keep your pee pale yellow.  Keep all follow-up visits.   Contact a doctor if:  You do not get better after 1-2 days.  Your symptoms go away and then come back. Get help right away if:  You have very bad back pain.  You have very bad pain in your lower belly.  You have a fever.  You have chills.  You feeling like you will vomit or you vomit. Summary  A urinary tract infection (UTI) is an infection of any part of the urinary tract.  This condition is caused by   germs in your genital area.  There are many risk factors for a UTI.  Treatment includes antibiotic medicines.  Drink enough fluid to keep your pee pale yellow. This information is not intended to replace advice given to you by your health care provider. Make sure you discuss any questions you have with your health care provider. Document Revised: 06/29/2020 Document Reviewed: 06/29/2020 Elsevier Patient Education  2021 Elsevier Inc.  

## 2021-05-07 NOTE — Progress Notes (Signed)
BP 133/85   Pulse 61   Temp 98.9 F (37.2 C)   Wt 160 lb (72.6 kg)   LMP 04/30/2015 (Exact Date)   SpO2 97%   BMI 28.80 kg/m    Subjective:    Patient ID: Kimberly Yang, female    DOB: May 11, 1961, 60 y.o.   MRN: [id number]  HPI: Kimberly Yang is a 60 y.o. female  Chief Complaint  Patient presents with  . Dysuria   URINARY SYMPTOMS Started with symptoms 3 days ago, started with burning. Dysuria: burning Urinary frequency: yes Urgency: yes Small volume voids: no Symptom severity: yes Urinary incontinence: no Foul odor: a little stronger Hematuria: no Abdominal pain: no Back pain: no Suprapubic pain/pressure: no Flank pain: no Fever:  none Vomiting: no Relief with cranberry juice: yes -- several bottles Relief with pyridium: none taken Status: stable Previous urinary tract infection: no Recurrent urinary tract infection: no Sexual activity: monogomous History of sexually transmitted disease: none Treatments attempted: cranberry and increasing fluids   Relevant past medical, surgical, family and social history reviewed and updated as indicated. Interim medical history since our last visit reviewed. Allergies and medications reviewed and updated.  Review of Systems  Constitutional: Negative.   Respiratory: Negative.   Cardiovascular: Negative.   Gastrointestinal: Negative for abdominal distention, abdominal pain, constipation, diarrhea, nausea and vomiting.  Genitourinary: Positive for dysuria, frequency and urgency. Negative for hematuria, pelvic pain and vaginal discharge.  Neurological: Negative.   Psychiatric/Behavioral: Negative.     Per HPI unless specifically indicated above     Objective:    BP 133/85   Pulse 61   Temp 98.9 F (37.2 C)   Wt 160 lb (72.6 kg)   LMP 04/30/2015 (Exact Date)   SpO2 97%   BMI 28.80 kg/m   Wt Readings from Last 3 Encounters:  05/07/21 160 lb (72.6 kg)  08/20/20 168 lb (76.2 kg)  06/12/20 172 lb (78 kg)     Physical Exam Vitals and nursing note reviewed.  Constitutional:      General: She is awake.     Appearance: She is well-developed.  HENT:     Head: Normocephalic.     Right Ear: Hearing normal.     Left Ear: Hearing normal.     Nose: Nose normal.     Mouth/Throat:     Mouth: Mucous membranes are moist.  Eyes:     General: Lids are normal.        Right eye: No discharge.        Left eye: No discharge.     Conjunctiva/sclera: Conjunctivae normal.     Pupils: Pupils are equal, round, and reactive to light.  Neck:     Thyroid: No thyromegaly.     Vascular: No carotid bruit or JVD.  Cardiovascular:     Rate and Rhythm: Normal rate and regular rhythm.     Heart sounds: Normal heart sounds. No murmur heard. No gallop.   Pulmonary:     Effort: Pulmonary effort is normal.     Breath sounds: Normal breath sounds.  Abdominal:     General: Bowel sounds are normal.     Palpations: Abdomen is soft. There is no hepatomegaly or splenomegaly.  Musculoskeletal:     Cervical back: Normal range of motion and neck supple.     Right lower leg: No edema.     Left lower leg: No edema.  Lymphadenopathy:     Cervical: No cervical adenopathy.  Skin:  General: Skin is warm and dry.  Neurological:     Mental Status: She is alert and oriented to person, place, and time.  Psychiatric:        Attention and Perception: Attention normal.        Mood and Affect: Mood normal.        Behavior: Behavior normal. Behavior is cooperative.        Thought Content: Thought content normal.        Judgment: Judgment normal.     Results for orders placed or performed in visit on 08/20/20  Novel Coronavirus, NAA ([facility])   Specimen: Saline  Result Value Ref Range   SARS-CoV-2, NAA Not Detected Not Detected  SARS-COV-2, NAA 2 DAY TAT  Result Value Ref Range   SARS-CoV-2, NAA 2 DAY TAT Performed       Assessment & Plan:   Problem List Items Addressed This Visit      Other   Dysuria - Primary     Acute with mild dysuria ongoing, UA noting trace BLD, 1+ LEUKS, and few bacteria.  Will sent for culture.  Wet prep negative.  At this time discussed with patient and will send for culture, no treatment until culture returns.  If returns + then will send in abx therapy for treatment.  Return for worsening or ongoing symptoms.      Relevant Orders   Urinalysis, Routine w reflex microscopic   WET PREP FOR TRICH, YEAST, CLUE    Other Visit Diagnoses    Pyuria       Urine for culture.   Relevant Orders   Urine Culture       Follow up plan: Return if symptoms worsen or fail to improve.

## 2021-05-07 NOTE — Assessment & Plan Note (Signed)
Acute with mild dysuria ongoing, UA noting trace BLD, 1+ LEUKS, and few bacteria.  Will sent for culture.  Wet prep negative.  At this time discussed with patient and will send for culture, no treatment until culture returns.  If returns + then will send in abx therapy for treatment.  Return for worsening or ongoing symptoms.

## 2021-05-11 LAB — URINE CULTURE

## 2021-05-11 MED ORDER — AMOXICILLIN-POT CLAVULANATE 875-125 MG PO TABS: 1.0000 | ORAL_TABLET | Freq: Two times a day (BID) | ORAL | 0 refills | Status: DC

## 2021-05-11 MED ORDER — AMOXICILLIN-POT CLAVULANATE 875-125 MG PO TABS: 1.0000 | ORAL_TABLET | Freq: Two times a day (BID) | ORAL | 0 refills | Status: AC

## 2021-05-11 NOTE — Progress Notes (Signed)
Contacted via MyChart   Good afternoon Kimberly Yang, your urine culture and sensitivities returned showing a urine infection present.  I sent in Augmentin to [place], but looks like they are closed today.  I will resend this to [facility] in [place] on [address], so you can pick up today.  Any questions?  Complete entire course and if no improvement return to see [place]. Keep being awesome!!  Thank you for allowing me to participate in your care.  I appreciate you. Kindest regards, Trevione Wert

## 2021-05-11 NOTE — Addendum Note (Signed)
Addended by: Aura Dials T on: 05/11/2021 03:04 PM   Modules accepted: Orders

## 2021-05-11 NOTE — Addendum Note (Signed)
Addended by: Aura Dials T on: 05/11/2021 03:01 PM   Modules accepted: Orders

## 2021-06-11 ENCOUNTER — Other Ambulatory Visit: Payer: Self-pay | Admitting: Family Medicine

## 2021-06-18 ENCOUNTER — Encounter: Payer: Self-pay | Admitting: Family Medicine

## 2021-06-18 ENCOUNTER — Ambulatory Visit (INDEPENDENT_AMBULATORY_CARE_PROVIDER_SITE_OTHER): Payer: BC Managed Care – PPO | Admitting: Family Medicine

## 2021-06-18 ENCOUNTER — Other Ambulatory Visit: Payer: Self-pay

## 2021-06-18 VITALS — BP 127/80 | HR 57 | Temp 97.9°F | Ht 62.8 in | Wt 159.8 lb

## 2021-06-18 DIAGNOSIS — E785 Hyperlipidemia, unspecified: Secondary | ICD-10-CM

## 2021-06-18 DIAGNOSIS — Z0000 Encounter for general adult medical examination without abnormal findings: Secondary | ICD-10-CM

## 2021-06-18 DIAGNOSIS — I10 Essential (primary) hypertension: Secondary | ICD-10-CM

## 2021-06-18 DIAGNOSIS — G4700 Insomnia, unspecified: Secondary | ICD-10-CM | POA: Diagnosis not present

## 2021-06-18 DIAGNOSIS — Z1231 Encounter for screening mammogram for malignant neoplasm of breast: Secondary | ICD-10-CM

## 2021-06-18 LAB — URINALYSIS, ROUTINE W REFLEX MICROSCOPIC
Bilirubin, UA: NEGATIVE
Glucose, UA: NEGATIVE
Ketones, UA: NEGATIVE
Nitrite, UA: NEGATIVE
Protein,UA: NEGATIVE
RBC, UA: NEGATIVE
Specific Gravity, UA: 1.015 (ref 1.005–1.030)
Urobilinogen, Ur: 0.2 mg/dL (ref 0.2–1.0)
pH, UA: 6 (ref 5.0–7.5)

## 2021-06-18 LAB — MICROSCOPIC EXAMINATION
Bacteria, UA: NONE SEEN
RBC, Urine: NONE SEEN /hpf (ref 0–2)

## 2021-06-18 LAB — MICROALBUMIN, URINE WAIVED
Creatinine, Urine Waived: 50 mg/dL (ref 10–300)
Microalb, Ur Waived: 10 mg/L (ref 0–19)
Microalb/Creat Ratio: <30 mg/g (ref <30)

## 2021-06-18 MED ORDER — BISOPROLOL-HYDROCHLOROTHIAZIDE 5-6.25 MG PO TABS: 1.0000 | ORAL_TABLET | Freq: Every day | ORAL | 1 refills | Status: DC

## 2021-06-18 MED ORDER — ATORVASTATIN CALCIUM 20 MG PO TABS: 20.0000 mg | ORAL_TABLET | Freq: Every day | ORAL | 1 refills | Status: DC

## 2021-06-18 MED ORDER — TRAZODONE HCL 50 MG PO TABS: 25.0000 mg | ORAL_TABLET | Freq: Every evening | ORAL | 11 refills | Status: DC | PRN

## 2021-06-18 MED ORDER — FLUTICASONE PROPIONATE 50 MCG/ACT NA SUSP: 1.0000 | Freq: Two times a day (BID) | NASAL | 12 refills | Status: DC

## 2021-06-18 MED ORDER — MONTELUKAST SODIUM 10 MG PO TABS: 10.0000 mg | ORAL_TABLET | Freq: Every day | ORAL | 3 refills | Status: DC

## 2021-06-18 NOTE — Assessment & Plan Note (Signed)
Under good control on current regimen. Continue current regimen. Continue to monitor. Call with any concerns. Refills given. Labs drawn today.   

## 2021-06-18 NOTE — Progress Notes (Signed)
BP 127/80   Pulse (!) 57   Temp 97.9 F (36.6 C) (Oral)   Ht 5' 2.8" (1.595 m)   Wt 159 lb 12.8 oz (72.5 kg)   LMP 04/30/2015 (Exact Date)   SpO2 98%   BMI 28.49 kg/m    Subjective:    Patient ID: Kimberly Yang, female    DOB: Oct 06, 1961, 60 y.o.   MRN: [id number]  HPI: Kimberly Yang is a 60 y.o. female presenting on 06/18/2021 for comprehensive medical examination. Current medical complaints include:  HYPERTENSION / HYPERLIPIDEMIA Satisfied with current treatment? yes Duration of hypertension: chronic BP monitoring frequency: not checking BP medication side effects: no Past BP meds: bisoprolol-HCTZ Duration of hyperlipidemia: chronic Cholesterol medication side effects: no Cholesterol supplements: none Past cholesterol medications: atorvastatin Medication compliance: excellent compliance Aspirin: no Recent stressors: no Recurrent headaches: no Visual changes: no Palpitations: no Dyspnea: no Chest pain: no Lower extremity edema: no Dizzy/lightheaded: no  Menopausal Symptoms: yes- not bothering her too much  Depression Screen done today and results listed below:  Depression screen [facility] 2/9 06/18/2021 05/07/2021 08/26/2019 06/15/2018 05/20/2018  Decreased Interest 0 0 0 0 0  Down, Depressed, Hopeless 0 0 0 0 0  PHQ - 2 Score 0 0 0 0 0  Altered sleeping - - 0 - -  Tired, decreased energy - - 0 - -  Change in appetite - - 0 - -  Feeling bad or failure about yourself  - - 0 - -  Trouble concentrating - - 0 - -  Moving slowly or fidgety/restless - - 0 - -  Suicidal thoughts - - 0 - -  PHQ-9 Score - - 0 - -  Difficult doing work/chores - - Not difficult at all - -    Past Medical History:  Past Medical History:  Diagnosis Date   Allergic rhinitis    Headache    Hyperlipidemia    Sinusitis    Vaginitis     Surgical History:  Past Surgical History:  Procedure Laterality Date   ABDOMINAL HYSTERECTOMY     COLONOSCOPY     CYSTOSCOPY N/A 05/21/2015   Procedure:  CYSTOSCOPY;  Surgeon: Christeen Douglas, MD;  Location: [facility];  Service: Gynecology;  Laterality: N/A;   HYSTEROSCOPY WITH D & C N/A 04/20/2015   Procedure: DILATATION AND CURETTAGE /HYSTEROSCOPY;  Surgeon: Christeen Douglas, MD;  Location: [facility];  Service: Gynecology;  Laterality: N/A;   LAPAROSCOPIC ASSISTED VAGINAL HYSTERECTOMY N/A 05/21/2015   Procedure: LAPAROSCOPIC ASSISTED VAGINAL HYSTERECTOMY;  Surgeon: Christeen Douglas, MD;  Location: [facility];  Service: Gynecology;  Laterality: N/A;   LAPAROSCOPIC SALPINGO OOPHERECTOMY Bilateral 05/21/2015   Procedure: LAPAROSCOPIC SALPINGO OOPHORECTOMY;  Surgeon: Christeen Douglas, MD;  Location: [facility];  Service: Gynecology;  Laterality: Bilateral;   NASAL SINUS SURGERY      Medications:  Current Outpatient Medications on File Prior to Visit  Medication Sig   aspirin EC 81 MG tablet Take 81 mg by mouth daily.   cholecalciferol (VITAMIN D) 1000 UNITS tablet Take 1,000 Units by mouth daily.   No current facility-administered medications on file prior to visit.    Allergies:  No Known Allergies  Social History:  Social History   Socioeconomic History   Marital status: Divorced    Spouse name: Not on file   Number of children: Not on file   Years of education: Not on file   Highest education level: Not on file  Occupational History   Not on file  Tobacco Use   Smoking status: Never   Smokeless tobacco: Never  Vaping Use   Vaping Use: Never used  Substance and Sexual Activity   Alcohol use: No   Drug use: No   Sexual activity: Yes    Birth control/protection: Pill  Other Topics Concern   Not on file  Social History Narrative   Not on file   Social Determinants of Health   Financial Resource Strain: Not on file  Food Insecurity: Not on file  Transportation Needs: Not on file  Physical Activity: Not on file  Stress: Not on file  Social Connections: Not on file  Intimate Partner Violence: Not on file   Social History    Tobacco Use  Smoking Status Never  Smokeless Tobacco Never   Social History   Substance and Sexual Activity  Alcohol Use No    Family History:  Family History  Problem Relation Age of Onset   Breast cancer Neg Hx     Past medical history, surgical history, medications, allergies, family history and social history reviewed with patient today and changes made to appropriate areas of the chart.   Review of Systems  Constitutional: Negative.   HENT: Negative.    Eyes: Negative.   Respiratory: Negative.    Cardiovascular: Negative.   Gastrointestinal: Negative.   Genitourinary: Negative.   Musculoskeletal: Negative.   Skin: Negative.   Neurological: Negative.   Endo/Heme/Allergies:  Positive for environmental allergies. Negative for polydipsia. Does not bruise/bleed easily.  Psychiatric/Behavioral: Negative.    All other ROS negative except what is listed above and in the HPI.      Objective:    BP 127/80   Pulse (!) 57   Temp 97.9 F (36.6 C) (Oral)   Ht 5' 2.8" (1.595 m)   Wt 159 lb 12.8 oz (72.5 kg)   LMP 04/30/2015 (Exact Date)   SpO2 98%   BMI 28.49 kg/m   Wt Readings from Last 3 Encounters:  06/18/21 159 lb 12.8 oz (72.5 kg)  05/07/21 160 lb (72.6 kg)  08/20/20 168 lb (76.2 kg)    Physical Exam Vitals and nursing note reviewed.  Constitutional:      General: She is not in acute distress.    Appearance: Normal appearance. She is not ill-appearing, toxic-appearing or diaphoretic.  HENT:     Head: Normocephalic and atraumatic.     Right Ear: Tympanic membrane, ear canal and external ear normal. There is no impacted cerumen.     Left Ear: Tympanic membrane, ear canal and external ear normal. There is no impacted cerumen.     Nose: Nose normal. No congestion or rhinorrhea.     Mouth/Throat:     Mouth: Mucous membranes are moist.     Pharynx: Oropharynx is clear. No oropharyngeal exudate or posterior oropharyngeal erythema.  Eyes:     General: No  scleral icterus.       Right eye: No discharge.        Left eye: No discharge.     Extraocular Movements: Extraocular movements intact.     Conjunctiva/sclera: Conjunctivae normal.     Pupils: Pupils are equal, round, and reactive to light.  Neck:     Vascular: No carotid bruit.  Cardiovascular:     Rate and Rhythm: Normal rate and regular rhythm.     Pulses: Normal pulses.     Heart sounds: No murmur heard.   No friction rub. No gallop.  Pulmonary:     Effort: Pulmonary effort  is normal. No respiratory distress.     Breath sounds: Normal breath sounds. No stridor. No wheezing, rhonchi or rales.  Chest:     Chest wall: No tenderness.  Abdominal:     General: Abdomen is flat. Bowel sounds are normal. There is no distension.     Palpations: Abdomen is soft. There is no mass.     Tenderness: There is no abdominal tenderness. There is no right CVA tenderness, left CVA tenderness, guarding or rebound.     Hernia: No hernia is present.  Genitourinary:    Comments: Breast and pelvic exams deferred with shared decision making Musculoskeletal:        General: No swelling, tenderness, deformity or signs of injury.     Cervical back: Normal range of motion and neck supple. No rigidity. No muscular tenderness.     Right lower leg: No edema.     Left lower leg: No edema.  Lymphadenopathy:     Cervical: No cervical adenopathy.  Skin:    General: Skin is warm and dry.     Capillary Refill: Capillary refill takes less than 2 seconds.     Coloration: Skin is not jaundiced or pale.     Findings: No bruising, erythema, lesion or rash.  Neurological:     General: No focal deficit present.     Mental Status: She is alert and oriented to person, place, and time. Mental status is at baseline.     Cranial Nerves: No cranial nerve deficit.     Sensory: No sensory deficit.     Motor: No weakness.     Coordination: Coordination normal.     Gait: Gait normal.     Deep Tendon Reflexes: Reflexes  normal.  Psychiatric:        Mood and Affect: Mood normal.        Behavior: Behavior normal.        Thought Content: Thought content normal.        Judgment: Judgment normal.    Results for orders placed or performed in visit on 05/07/21  WET PREP FOR TRICH, YEAST, CLUE   Specimen: Sterile Swab   Sterile Swab  Result Value Ref Range   Trichomonas Exam Negative Negative   Yeast Exam Negative Negative   Clue Cell Exam Negative Negative  Urine Culture   Specimen: Urine   UR  Result Value Ref Range   Urine Culture, Routine Final report (A)    Organism ID, Bacteria Escherichia coli (A)    Antimicrobial Susceptibility Comment   Microscopic Examination   Urine  Result Value Ref Range   WBC, UA 0-5 0 - 5 /hpf   RBC 0-2 0 - 2 /hpf   Epithelial Cells (non renal) None seen 0 - 10 /hpf   Bacteria, UA Few (A) None seen/Few  Urinalysis, Routine w reflex microscopic  Result Value Ref Range   Specific Gravity, UA <1.005 (L) 1.005 - 1.030   pH, UA 5.5 5.0 - 7.5   Color, UA Yellow Yellow   Appearance Ur Cloudy (A) Clear   Leukocytes,UA 1+ (A) Negative   Protein,UA Negative Negative/Trace   Glucose, UA Negative Negative   Ketones, UA Negative Negative   RBC, UA Trace (A) Negative   Bilirubin, UA Negative Negative   Urobilinogen, Ur 0.2 0.2 - 1.0 mg/dL   Nitrite, UA Negative Negative   Microscopic Examination See below:       Assessment & Plan:   Problem List Items Addressed This Visit  Cardiovascular and Mediastinum   Hypertension    Under good control on current regimen. Continue current regimen. Continue to monitor. Call with any concerns. Refills given. Labs drawn today.         Relevant Medications   atorvastatin (LIPITOR) 20 MG tablet   bisoprolol-hydrochlorothiazide (ZIAC) 5-6.25 MG tablet   Other Relevant Orders   Microalbumin, Urine Waived     Other   Hyperlipidemia    Under good control on current regimen. Continue current regimen. Continue to monitor.  Call with any concerns. Refills given. Labs drawn today.        Relevant Medications   atorvastatin (LIPITOR) 20 MG tablet   bisoprolol-hydrochlorothiazide (ZIAC) 5-6.25 MG tablet   Insomnia    Under good control on current regimen. Continue current regimen. Continue to monitor. Call with any concerns. Refills given. Labs drawn today.        Relevant Medications   traZODone (DESYREL) 50 MG tablet   Other Visit Diagnoses     Routine general medical examination at a health care facility    -  Primary   Vaccines up to date. Screening labs checked today. Pap N/A. Mammogram ordered. Colonoscopy up to date. Continue diet and exercise. Call with any concerns.    Relevant Orders   CBC with Differential/Platelet   Comprehensive metabolic panel   Lipid Panel w/o Chol/HDL Ratio   Urinalysis, Routine w reflex microscopic   TSH   VITAMIN D 25 Hydroxy (Vit-D Deficiency, Fractures)   B12   Encounter for screening mammogram for malignant neoplasm of breast       Mammogram ordered today.    Relevant Orders   MM 3D SCREEN BREAST BILATERAL   Essential hypertension       Relevant Medications   atorvastatin (LIPITOR) 20 MG tablet   bisoprolol-hydrochlorothiazide (ZIAC) 5-6.25 MG tablet        Follow up plan: Return in about 6 months (around 12/19/2021).   LABORATORY TESTING:  - Pap smear: not applicable  IMMUNIZATIONS:   - Tdap: Tetanus vaccination status reviewed: last tetanus booster within 10 years. - Influenza: Postponed to flu season - Pneumovax: Not applicable - Prevnar: Not applicable - COVID: Up to date - Shingrix vaccine: Up to date  SCREENING: -Mammogram: Ordered today  - Colonoscopy: Up to date    PATIENT COUNSELING:   Advised to take 1 mg of folate supplement per day if capable of pregnancy.   Sexuality: Discussed sexually transmitted diseases, partner selection, use of condoms, avoidance of unintended pregnancy  and contraceptive alternatives.   Advised to avoid  cigarette smoking.  I discussed with the patient that most people either abstain from alcohol or drink within safe limits (<=14/week and <=4 drinks/occasion for males, <=7/weeks and <= 3 drinks/occasion for females) and that the risk for alcohol disorders and other health effects rises proportionally with the number of drinks per week and how often a drinker exceeds daily limits.  Discussed cessation/primary prevention of drug use and availability of treatment for abuse.   Diet: Encouraged to adjust caloric intake to maintain  or achieve ideal body weight, to reduce intake of dietary saturated fat and total fat, to limit sodium intake by avoiding high sodium foods and not adding table salt, and to maintain adequate dietary potassium and calcium preferably from fresh fruits, vegetables, and low-fat dairy products.    stressed the importance of regular exercise  Injury prevention: Discussed safety belts, safety helmets, smoke detector, smoking near bedding or upholstery.   Dental health:  Discussed importance of regular tooth brushing, flossing, and dental visits.    NEXT PREVENTATIVE PHYSICAL DUE IN 1 YEAR. Return in about 6 months (around 12/19/2021).

## 2021-06-19 LAB — CBC WITH DIFFERENTIAL/PLATELET
Basophils Absolute: 0.1 10*3/uL (ref 0.0–0.2)
Basos: 1 %
EOS (ABSOLUTE): 0.2 10*3/uL (ref 0.0–0.4)
Eos: 3 %
Hematocrit: 43 % (ref 34.0–46.6)
Hemoglobin: 14 g/dL (ref 11.1–15.9)
Immature Grans (Abs): 0 10*3/uL (ref 0.0–0.1)
Immature Granulocytes: 0 %
Lymphocytes Absolute: 2.2 10*3/uL (ref 0.7–3.1)
Lymphs: 39 %
MCH: 30.6 pg (ref 26.6–33.0)
MCHC: 32.6 g/dL (ref 31.5–35.7)
MCV: 94 fL (ref 79–97)
Monocytes Absolute: 0.4 10*3/uL (ref 0.1–0.9)
Monocytes: 8 %
Neutrophils Absolute: 2.8 10*3/uL (ref 1.4–7.0)
Neutrophils: 49 %
Platelets: 261 10*3/uL (ref 150–450)
RBC: 4.57 x10E6/uL (ref 3.77–5.28)
RDW: 11.9 % (ref 11.7–15.4)
WBC: 5.7 10*3/uL (ref 3.4–10.8)

## 2021-06-19 LAB — LIPID PANEL W/O CHOL/HDL RATIO
Cholesterol, Total: 164 mg/dL (ref 100–199)
HDL: 59 mg/dL (ref >39)
LDL Chol Calc (NIH): 86 mg/dL (ref 0–99)
Triglycerides: 106 mg/dL (ref 0–149)
VLDL Cholesterol Cal: 19 mg/dL (ref 5–40)

## 2021-06-19 LAB — COMPREHENSIVE METABOLIC PANEL
ALT: 19 IU/L (ref 0–32)
AST: 21 IU/L (ref 0–40)
Albumin/Globulin Ratio: 2 (ref 1.2–2.2)
Albumin: 4.5 g/dL (ref 3.8–4.8)
Alkaline Phosphatase: 107 IU/L (ref 44–121)
BUN/Creatinine Ratio: 14 (ref 12–28)
BUN: 10 mg/dL (ref 8–27)
Bilirubin Total: 0.4 mg/dL (ref 0.0–1.2)
CO2: 25 mmol/L (ref 20–29)
Calcium: 9.2 mg/dL (ref 8.7–10.3)
Chloride: 95 mmol/L — ABNORMAL LOW (ref 96–106)
Creatinine, Ser: 0.74 mg/dL (ref 0.57–1.00)
Globulin, Total: 2.2 g/dL (ref 1.5–4.5)
Glucose: 81 mg/dL (ref 65–99)
Potassium: 3.8 mmol/L (ref 3.5–5.2)
Sodium: 137 mmol/L (ref 134–144)
Total Protein: 6.7 g/dL (ref 6.0–8.5)
eGFR: 91 mL/min/{1.73_m2} (ref >59)

## 2021-06-19 LAB — VITAMIN B12: Vitamin B-12: >2000 pg/mL — ABNORMAL HIGH (ref 232–1245)

## 2021-06-19 LAB — VITAMIN D 25 HYDROXY (VIT D DEFICIENCY, FRACTURES): Vit D, 25-Hydroxy: 40.6 ng/mL (ref 30.0–100.0)

## 2021-06-19 LAB — TSH: TSH: 2.95 u[IU]/mL (ref 0.450–4.500)

## 2021-06-25 ENCOUNTER — Encounter: Payer: Self-pay | Admitting: Family Medicine

## 2021-09-13 ENCOUNTER — Ambulatory Visit: Payer: BC Managed Care – PPO | Admitting: Family Medicine

## 2021-11-19 ENCOUNTER — Other Ambulatory Visit: Payer: Self-pay

## 2021-11-19 ENCOUNTER — Ambulatory Visit
Admission: RE | Admit: 2021-11-19 | Discharge: 2021-11-19 | Disposition: A | Discharge status: Home / Self Care | Payer: BC Managed Care – PPO | Source: Ambulatory Visit | Attending: Family Medicine | Admitting: Family Medicine

## 2021-11-19 DIAGNOSIS — Z1231 Encounter for screening mammogram for malignant neoplasm of breast: Secondary | ICD-10-CM | POA: Diagnosis present

## 2021-12-19 ENCOUNTER — Ambulatory Visit: Payer: BC Managed Care – PPO | Admitting: Family Medicine

## 2021-12-27 ENCOUNTER — Other Ambulatory Visit: Payer: Self-pay

## 2021-12-27 ENCOUNTER — Encounter: Payer: Self-pay | Admitting: Family Medicine

## 2021-12-27 ENCOUNTER — Ambulatory Visit: Payer: BC Managed Care – PPO | Admitting: Family Medicine

## 2021-12-27 VITALS — BP 116/76 | HR 63 | Temp 98.3°F | Wt 165.0 lb

## 2021-12-27 DIAGNOSIS — M25841 Other specified joint disorders, right hand: Secondary | ICD-10-CM

## 2021-12-27 DIAGNOSIS — I10 Essential (primary) hypertension: Secondary | ICD-10-CM

## 2021-12-27 DIAGNOSIS — J0100 Acute maxillary sinusitis, unspecified: Secondary | ICD-10-CM

## 2021-12-27 DIAGNOSIS — G4700 Insomnia, unspecified: Secondary | ICD-10-CM

## 2021-12-27 DIAGNOSIS — E785 Hyperlipidemia, unspecified: Secondary | ICD-10-CM

## 2021-12-27 MED ORDER — TRAZODONE HCL 100 MG PO TABS: 50.0000 mg | ORAL_TABLET | Freq: Every evening | ORAL | 12 refills | Status: DC | PRN

## 2021-12-27 MED ORDER — BENZONATATE 200 MG PO CAPS: 200.0000 mg | ORAL_CAPSULE | Freq: Two times a day (BID) | ORAL | 1 refills | Status: DC | PRN

## 2021-12-27 MED ORDER — AMOXICILLIN-POT CLAVULANATE 875-125 MG PO TABS: 1.0000 | ORAL_TABLET | Freq: Two times a day (BID) | ORAL | 0 refills | Status: DC

## 2021-12-27 MED ORDER — BISOPROLOL-HYDROCHLOROTHIAZIDE 5-6.25 MG PO TABS: 1.0000 | ORAL_TABLET | Freq: Every day | ORAL | 1 refills | Status: DC

## 2021-12-27 MED ORDER — ATORVASTATIN CALCIUM 20 MG PO TABS: 20.0000 mg | ORAL_TABLET | Freq: Every day | ORAL | 1 refills | Status: DC

## 2021-12-27 NOTE — Assessment & Plan Note (Signed)
Under good control on current regimen. Continue current regimen. Continue to monitor. Call with any concerns. Refills given.   

## 2021-12-27 NOTE — Assessment & Plan Note (Signed)
Under good control on current regimen. Continue current regimen. Continue to monitor. Call with any concerns. Refills given. Labs drawn today.   

## 2021-12-27 NOTE — Progress Notes (Signed)
BP 116/76    Pulse 63    Temp 98.3 F (36.8 C)    Wt 165 lb (74.8 kg)    LMP 04/30/2015 (Exact Date)    SpO2 98%    BMI 29.41 kg/m    Subjective:    Patient ID: Kimberly Yang, female    DOB: [birth year], 61 y.o.   MRN: [id number]  HPI: Kimberly Yang is a 61 y.o. female  Chief Complaint  Patient presents with   Hypertension   Hyperlipidemia   Cyst    Patient states about 6 months ago she noticed a knot on her hand, has been growing in size    Sinusitis    Patient states she is having a lot of sinus drainage and facial pressure for about 4-5 days.    HYPERTENSION / HYPERLIPIDEMIA Satisfied with current treatment? yes Duration of hypertension: chronic BP monitoring frequency: not checking BP medication side effects: no Past BP meds: bisoprolol-HCTZ Duration of hyperlipidemia: chronic Cholesterol medication side effects: no Cholesterol supplements: none Past cholesterol medications: atorvastatin Medication compliance: excellent compliance Aspirin: no Recent stressors: no Recurrent headaches: no Visual changes: no Palpitations: no Dyspnea: no Chest pain: no Lower extremity edema: no Dizzy/lightheaded: no  UPPER RESPIRATORY TRACT INFECTION Duration: 4-5 days Worst symptom: nasal congestion Fever: no Cough: yes Shortness of breath: no Wheezing: no Chest pain: no Chest tightness: no Chest congestion: no Nasal congestion: yes Runny nose: yes Post nasal drip: yes Sneezing: no Sore throat: no Swollen glands: no Sinus pressure: yes Headache: yes Face pain: yes Toothache: no Ear pain: no  Ear pressure: no  Eyes red/itching:no Eye drainage/crusting: no  Vomiting: no Rash: no Fatigue: yes Sick contacts: no Strep contacts: no  Context: worse Recurrent sinusitis: no Relief with OTC cold/cough medications: no  Treatments attempted: cold/sinus, mucinex, and pseudoephedrine   Relevant past medical, surgical, family and social history reviewed and updated as  indicated. Interim medical history since our last visit reviewed. Allergies and medications reviewed and updated.  Review of Systems  Constitutional:  Positive for fatigue and fever. Negative for activity change, appetite change, chills, diaphoresis and unexpected weight change.  HENT:  Positive for congestion, postnasal drip, rhinorrhea, sinus pressure and sinus pain. Negative for dental problem, drooling, ear discharge, ear pain, facial swelling, hearing loss, mouth sores, nosebleeds, sneezing, sore throat, tinnitus, trouble swallowing and voice change.   Eyes: Negative.   Respiratory: Negative.    Cardiovascular: Negative.   Gastrointestinal: Negative.   Genitourinary: Negative.   Musculoskeletal: Negative.   Psychiatric/Behavioral: Negative.     Per HPI unless specifically indicated above     Objective:    BP 116/76    Pulse 63    Temp 98.3 F (36.8 C)    Wt 165 lb (74.8 kg)    LMP 04/30/2015 (Exact Date)    SpO2 98%    BMI 29.41 kg/m   Wt Readings from Last 3 Encounters:  12/27/21 165 lb (74.8 kg)  06/18/21 159 lb 12.8 oz (72.5 kg)  05/07/21 160 lb (72.6 kg)    Physical Exam Vitals and nursing note reviewed.  Constitutional:      General: She is not in acute distress.    Appearance: Normal appearance. She is not ill-appearing, toxic-appearing or diaphoretic.  HENT:     Head: Normocephalic and atraumatic.     Right Ear: Tympanic membrane, ear canal and external ear normal. There is no impacted cerumen.     Left Ear: Tympanic membrane, ear canal  external ear normal. There is no impacted cerumen.  °   Nose: Congestion and rhinorrhea present.  °   Mouth/Throat:  °   Mouth: Mucous membranes are moist.  °   Pharynx: Oropharynx is clear. No oropharyngeal exudate or posterior oropharyngeal erythema.  °Eyes:  °   General: No scleral icterus.    °   Right eye: No discharge.     °   Left eye: No discharge.  °   Extraocular Movements: Extraocular movements intact.  °    Conjunctiva/sclera: Conjunctivae normal.  °   Pupils: Pupils are equal, round, and reactive to light.  °Cardiovascular:  °   Rate and Rhythm: Normal rate and regular rhythm.  °   Pulses: Normal pulses.  °   Heart sounds: Normal heart sounds. No murmur heard. °  No friction rub. No gallop.  °Pulmonary:  °   Effort: Pulmonary effort is normal. No respiratory distress.  °   Breath sounds: Normal breath sounds. No stridor. No wheezing, rhonchi or rales.  °Chest:  °   Chest wall: No tenderness.  °Musculoskeletal:     °   General: Normal range of motion.  °   Cervical back: Normal range of motion and neck supple.  °Skin: °   General: Skin is warm and dry.  °   Capillary Refill: Capillary refill takes less than 2 seconds.  °   Coloration: Skin is not jaundiced or pale.  °   Findings: No bruising, erythema, lesion or rash.  °Neurological:  °   General: No focal deficit present.  °   Mental Status: She is alert and oriented to person, place, and time. Mental status is at baseline.  °Psychiatric:     °   Mood and Affect: Mood normal.     °   Behavior: Behavior normal.     °   Thought Content: Thought content normal.     °   Judgment: Judgment normal.  ° ° °Results for orders placed or performed in visit on 06/18/21  °Microscopic Examination  ° Urine  °Result Value Ref Range  ° WBC, UA 0-5 0 - 5 /hpf  ° RBC None seen 0 - 2 /hpf  ° Epithelial Cells (non renal) 0-10 0 - 10 /hpf  ° Bacteria, UA None seen None seen/Few  °CBC with Differential/Platelet  °Result Value Ref Range  ° WBC 5.7 3.4 - 10.8 x10E3/uL  ° RBC 4.57 3.77 - 5.28 x10E6/uL  ° Hemoglobin 14.0 11.1 - 15.9 g/dL  ° Hematocrit 43.0 34.0 - 46.6 %  ° MCV 94 79 - 97 fL  ° MCH 30.6 26.6 - 33.0 pg  ° MCHC 32.6 31.5 - 35.7 g/dL  ° RDW 11.9 11.7 - 15.4 %  ° Platelets 261 150 - 450 x10E3/uL  ° Neutrophils 49 Not Estab. %  ° Lymphs 39 Not Estab. %  ° Monocytes 8 Not Estab. %  ° Eos 3 Not Estab. %  ° Basos 1 Not Estab. %  ° Neutrophils Absolute 2.8 1.4 - 7.0 x10E3/uL  °  Lymphocytes Absolute 2.2 0.7 - 3.1 x10E3/uL  ° Monocytes Absolute 0.4 0.1 - 0.9 x10E3/uL  ° EOS (ABSOLUTE) 0.2 0.0 - 0.4 x10E3/uL  ° Basophils Absolute 0.1 0.0 - 0.2 x10E3/uL  ° Immature Granulocytes 0 Not Estab. %  ° Immature Grans (Abs) 0.0 0.0 - 0.1 x10E3/uL  °Comprehensive metabolic panel  °Result Value Ref Range  ° Glucose 81 65 - 99 mg/dL  ° BUN 10   8 - 27 mg/dL  ° Creatinine, Ser 0.74 0.57 - 1.00 mg/dL  ° eGFR 91 >59 mL/min/1.73  ° BUN/Creatinine Ratio 14 12 - 28  ° Sodium 137 134 - 144 mmol/L  ° Potassium 3.8 3.5 - 5.2 mmol/L  ° Chloride 95 (L) 96 - 106 mmol/L  ° CO2 25 20 - 29 mmol/L  ° Calcium 9.2 8.7 - 10.3 mg/dL  ° Total Protein 6.7 6.0 - 8.5 g/dL  ° Albumin 4.5 3.8 - 4.8 g/dL  ° Globulin, Total 2.2 1.5 - 4.5 g/dL  ° Albumin/Globulin Ratio 2.0 1.2 - 2.2  ° Bilirubin Total 0.4 0.0 - 1.2 mg/dL  ° Alkaline Phosphatase 107 44 - 121 IU/L  ° AST 21 0 - 40 IU/L  ° ALT 19 0 - 32 IU/L  °Lipid Panel w/o Chol/HDL Ratio  °Result Value Ref Range  ° Cholesterol, Total 164 100 - 199 mg/dL  ° Triglycerides 106 0 - 149 mg/dL  ° HDL 59 >39 mg/dL  ° VLDL Cholesterol Cal 19 5 - 40 mg/dL  ° LDL Chol Calc (NIH) 86 0 - 99 mg/dL  °Urinalysis, Routine w reflex microscopic  °Result Value Ref Range  ° Specific Gravity, UA 1.015 1.005 - 1.030  ° pH, UA 6.0 5.0 - 7.5  ° Color, UA Yellow Yellow  ° Appearance Ur Clear Clear  ° Leukocytes,UA 1+ (A) Negative  ° Protein,UA Negative Negative/Trace  ° Glucose, UA Negative Negative  ° Ketones, UA Negative Negative  ° RBC, UA Negative Negative  ° Bilirubin, UA Negative Negative  ° Urobilinogen, Ur 0.2 0.2 - 1.0 mg/dL  ° Nitrite, UA Negative Negative  ° Microscopic Examination See below:   °TSH  °Result Value Ref Range  ° TSH 2.950 0.450 - 4.500 uIU/mL  °Microalbumin, Urine Waived  °Result Value Ref Range  ° Microalb, Ur Waived 10 0 - 19 mg/L  ° Creatinine, Urine Waived 50 10 - 300 mg/dL  ° Microalb/Creat Ratio <30 <30 mg/g  °VITAMIN D 25 Hydroxy (Vit-D Deficiency, Fractures)  °Result Value  Ref Range  ° Vit D, 25-Hydroxy 40.6 30.0 - 100.0 ng/mL  °B12  °Result Value Ref Range  ° Vitamin B-12 >2000 (H) 232 - 1245 pg/mL  ° °   °Assessment & Plan:  ° °Problem List Items Addressed This Visit   ° °  ° Cardiovascular and Mediastinum  ° Hypertension - Primary  °  Under good control on current regimen. Continue current regimen. Continue to monitor. Call with any concerns. Refills given. Labs drawn today. ° °  °  ° Relevant Medications  ° bisoprolol-hydrochlorothiazide (ZIAC) 5-6.25 MG tablet  ° atorvastatin (LIPITOR) 20 MG tablet  ° Other Relevant Orders  ° Comprehensive metabolic panel  °  ° Other  ° Hyperlipidemia  °  Under good control on current regimen. Continue current regimen. Continue to monitor. Call with any concerns. Refills given. Labs drawn today. ° °  °  ° Relevant Medications  ° bisoprolol-hydrochlorothiazide (ZIAC) 5-6.25 MG tablet  ° atorvastatin (LIPITOR) 20 MG tablet  ° Other Relevant Orders  ° Comprehensive metabolic panel  ° Lipid Panel w/o Chol/HDL Ratio  ° Insomnia  °  Under good control on current regimen. Continue current regimen. Continue to monitor. Call with any concerns. Refills given.  ° °  °  ° Relevant Medications  ° traZODone (DESYREL) 100 MG tablet  ° °Other Visit Diagnoses   ° ° Cyst of joint of hand, right      ° Referral to   hand surgery made today.  ° Relevant Orders  ° Ambulatory referral to Hand Surgery  ° Essential hypertension      ° Relevant Medications  ° bisoprolol-hydrochlorothiazide (ZIAC) 5-6.25 MG tablet  ° atorvastatin (LIPITOR) 20 MG tablet  ° Acute non-recurrent maxillary sinusitis      ° Will treat with augmentin. Call if not getting better or getting worse.   ° Relevant Medications  ° benzonatate (TESSALON) 200 MG capsule  ° amoxicillin-clavulanate (AUGMENTIN) 875-125 MG tablet  ° °  °  ° °Follow up plan: °Return in about 6 months (around 06/26/2022) for physical. ° ° ° ° ° °

## 2021-12-28 LAB — COMPREHENSIVE METABOLIC PANEL
ALT: 20 IU/L (ref 0–32)
AST: 25 IU/L (ref 0–40)
Albumin/Globulin Ratio: 2 (ref 1.2–2.2)
Albumin: 4.6 g/dL (ref 3.8–4.8)
Alkaline Phosphatase: 92 IU/L (ref 44–121)
BUN/Creatinine Ratio: 14 (ref 12–28)
BUN: 12 mg/dL (ref 8–27)
Bilirubin Total: 0.3 mg/dL (ref 0.0–1.2)
CO2: 26 mmol/L (ref 20–29)
Calcium: 9.5 mg/dL (ref 8.7–10.3)
Chloride: 100 mmol/L (ref 96–106)
Creatinine, Ser: 0.84 mg/dL (ref 0.57–1.00)
Globulin, Total: 2.3 g/dL (ref 1.5–4.5)
Glucose: 94 mg/dL (ref 70–99)
Potassium: 4.6 mmol/L (ref 3.5–5.2)
Sodium: 139 mmol/L (ref 134–144)
Total Protein: 6.9 g/dL (ref 6.0–8.5)
eGFR: 79 mL/min/{1.73_m2} (ref >59)

## 2021-12-28 LAB — LIPID PANEL W/O CHOL/HDL RATIO
Cholesterol, Total: 149 mg/dL (ref 100–199)
HDL: 58 mg/dL (ref >39)
LDL Chol Calc (NIH): 78 mg/dL (ref 0–99)
Triglycerides: 64 mg/dL (ref 0–149)
VLDL Cholesterol Cal: 13 mg/dL (ref 5–40)

## 2022-04-11 ENCOUNTER — Encounter: Payer: Self-pay | Admitting: Family Medicine

## 2022-04-11 ENCOUNTER — Ambulatory Visit: Payer: BC Managed Care – PPO | Admitting: Family Medicine

## 2022-04-11 VITALS — BP 112/71 | HR 58 | Temp 98.1°F | Wt 160.6 lb

## 2022-04-11 DIAGNOSIS — J0100 Acute maxillary sinusitis, unspecified: Secondary | ICD-10-CM

## 2022-04-11 MED ORDER — AMOXICILLIN-POT CLAVULANATE 875-125 MG PO TABS: 1.0000 | ORAL_TABLET | Freq: Two times a day (BID) | ORAL | 0 refills | Status: DC

## 2022-04-11 NOTE — Progress Notes (Signed)
? ?BP 112/71   Pulse (!) 58   Temp 98.1 ?F (36.7 ?C)   Wt 160 lb 9.6 oz (72.8 kg)   LMP 04/30/2015 (Exact Date)   SpO2 98%   BMI 28.63 kg/m?   ? ?Subjective:  ? ? Patient ID: KINZEE Yang, female    DOB: [birth year], 61 y.o.   MRN: [id number] ? ?HPI: ?Kimberly Yang is a 61 y.o. female ? ?Chief Complaint  ?Patient presents with  ? Sinusitis  ?  Patient states she is having facial pain, cough, and nasal congestion. Patient states symptoms began last Friday. Has tried sudafed, and mucinex otc  ? ?UPPER RESPIRATORY TRACT INFECTION ?Duration: 7-10 days ?Worst symptom: congestion ?Fever: no ?Cough: yes ?Shortness of breath: no ?Wheezing: no ?Chest pain: no ?Chest tightness: no ?Chest congestion: no ?Nasal congestion: yes ?Runny nose: yes ?Post nasal drip: yes ?Sneezing: no ?Sore throat: no ?Swollen glands: no ?Sinus pressure: yes ?Headache: yes ?Face pain: no ?Toothache: no ?Ear pain: no  ?Ear pressure: no  ?Eyes red/itching:no ?Eye drainage/crusting: no  ?Vomiting: no ?Rash: no ?Fatigue: yes ?Sick contacts: yes ?Strep contacts: no  ?Context: worse ?Recurrent sinusitis: no ?Relief with OTC cold/cough medications: no  ?Treatments attempted: cold/sinus, mucinex, anti-histamine, and pseudoephedrine  ? ?Relevant past medical, surgical, family and social history reviewed and updated as indicated. Interim medical history since our last visit reviewed. ?Allergies and medications reviewed and updated. ? ?Review of Systems  ?Constitutional:  Positive for chills, diaphoresis and fatigue. Negative for activity change, appetite change, fever and unexpected weight change.  ?HENT:  Positive for congestion, postnasal drip, rhinorrhea, sinus pressure and sinus pain. Negative for dental problem, drooling, ear discharge, ear pain, facial swelling, hearing loss, mouth sores, nosebleeds, sneezing, sore throat, tinnitus, trouble swallowing and voice change.   ?Eyes: Negative.   ?Respiratory: Negative.    ?Cardiovascular: Negative.    ?Gastrointestinal: Negative.   ?Psychiatric/Behavioral: Negative.    ? ?Per HPI unless specifically indicated above ? ?   ?Objective:  ?  ?BP 112/71   Pulse (!) 58   Temp 98.1 ?F (36.7 ?C)   Wt 160 lb 9.6 oz (72.8 kg)   LMP 04/30/2015 (Exact Date)   SpO2 98%   BMI 28.63 kg/m?   ?Wt Readings from Last 3 Encounters:  ?04/11/22 160 lb 9.6 oz (72.8 kg)  ?12/27/21 165 lb (74.8 kg)  ?06/18/21 159 lb 12.8 oz (72.5 kg)  ?  ?Physical Exam ?Vitals and nursing note reviewed.  ?Constitutional:   ?   General: She is not in acute distress. ?   Appearance: Normal appearance. She is normal weight. She is not ill-appearing, toxic-appearing or diaphoretic.  ?HENT:  ?   Head: Normocephalic and atraumatic.  ?   Right Ear: Tympanic membrane, ear canal and external ear normal.  ?   Left Ear: Tympanic membrane, ear canal and external ear normal.  ?   Nose: Congestion and rhinorrhea present.  ?   Mouth/Throat:  ?   Mouth: Mucous membranes are moist.  ?   Pharynx: Oropharynx is clear. No oropharyngeal exudate or posterior oropharyngeal erythema.  ?Eyes:  ?   General: No scleral icterus.    ?   Right eye: No discharge.     ?   Left eye: No discharge.  ?   Extraocular Movements: Extraocular movements intact.  ?   Conjunctiva/sclera: Conjunctivae normal.  ?   Pupils: Pupils are equal, round, and reactive to light.  ?Cardiovascular:  ?   Rate and Rhythm:  Normal rate and regular rhythm.  ?   Pulses: Normal pulses.  ?   Heart sounds: Normal heart sounds. No murmur heard. ?  No friction rub. No gallop.  ?Pulmonary:  ?   Effort: Pulmonary effort is normal. No respiratory distress.  ?   Breath sounds: Normal breath sounds. No stridor. No wheezing, rhonchi or rales.  ?Chest:  ?   Chest wall: No tenderness.  ?Musculoskeletal:     ?   General: Normal range of motion.  ?   Cervical back: Normal range of motion and neck supple.  ?Skin: ?   General: Skin is warm and dry.  ?   Capillary Refill: Capillary refill takes less than 2 seconds.  ?    Coloration: Skin is not jaundiced or pale.  ?   Findings: No bruising, erythema, lesion or rash.  ?Neurological:  ?   General: No focal deficit present.  ?   Mental Status: She is alert and oriented to person, place, and time. Mental status is at baseline.  ?Psychiatric:     ?   Mood and Affect: Mood normal.     ?   Behavior: Behavior normal.     ?   Thought Content: Thought content normal.     ?   Judgment: Judgment normal.  ? ? ?Results for orders placed or performed in visit on 12/27/21  ?Comprehensive metabolic panel  ?Result Value Ref Range  ? Glucose 94 70 - 99 mg/dL  ? BUN 12 8 - 27 mg/dL  ? Creatinine, Ser 0.84 0.57 - 1.00 mg/dL  ? eGFR 79 >59 mL/min/1.73  ? BUN/Creatinine Ratio 14 12 - 28  ? Sodium 139 134 - 144 mmol/L  ? Potassium 4.6 3.5 - 5.2 mmol/L  ? Chloride 100 96 - 106 mmol/L  ? CO2 26 20 - 29 mmol/L  ? Calcium 9.5 8.7 - 10.3 mg/dL  ? Total Protein 6.9 6.0 - 8.5 g/dL  ? Albumin 4.6 3.8 - 4.8 g/dL  ? Globulin, Total 2.3 1.5 - 4.5 g/dL  ? Albumin/Globulin Ratio 2.0 1.2 - 2.2  ? Bilirubin Total 0.3 0.0 - 1.2 mg/dL  ? Alkaline Phosphatase 92 44 - 121 IU/L  ? AST 25 0 - 40 IU/L  ? ALT 20 0 - 32 IU/L  ?Lipid Panel w/o Chol/HDL Ratio  ?Result Value Ref Range  ? Cholesterol, Total 149 100 - 199 mg/dL  ? Triglycerides 64 0 - 149 mg/dL  ? HDL 58 >39 mg/dL  ? VLDL Cholesterol Cal 13 5 - 40 mg/dL  ? LDL Chol Calc (NIH) 78 0 - 99 mg/dL  ? ?   ?Assessment & Plan:  ? ?Problem List Items Addressed This Visit   ?None ?Visit Diagnoses   ? ? Acute non-recurrent maxillary sinusitis    -  Primary  ? Will treat with augmentin. Call with any concerns. Continue to monitor.   ? Relevant Medications  ? amoxicillin-clavulanate (AUGMENTIN) 875-125 MG tablet  ? ?  ?  ? ?Follow up plan: ?Return if symptoms worsen or fail to improve. ? ? ? ? ? ?

## 2022-06-30 ENCOUNTER — Ambulatory Visit (INDEPENDENT_AMBULATORY_CARE_PROVIDER_SITE_OTHER): Payer: BC Managed Care – PPO | Admitting: Family Medicine

## 2022-06-30 ENCOUNTER — Encounter: Payer: Self-pay | Admitting: Family Medicine

## 2022-06-30 VITALS — BP 115/76 | HR 53 | Temp 98.3°F | Ht 63.0 in | Wt 164.9 lb

## 2022-06-30 DIAGNOSIS — Z0000 Encounter for general adult medical examination without abnormal findings: Secondary | ICD-10-CM

## 2022-06-30 DIAGNOSIS — G4700 Insomnia, unspecified: Secondary | ICD-10-CM | POA: Diagnosis not present

## 2022-06-30 DIAGNOSIS — E785 Hyperlipidemia, unspecified: Secondary | ICD-10-CM

## 2022-06-30 DIAGNOSIS — I10 Essential (primary) hypertension: Secondary | ICD-10-CM | POA: Diagnosis not present

## 2022-06-30 DIAGNOSIS — Z1231 Encounter for screening mammogram for malignant neoplasm of breast: Secondary | ICD-10-CM

## 2022-06-30 LAB — URINALYSIS, ROUTINE W REFLEX MICROSCOPIC
Bilirubin, UA: NEGATIVE
Glucose, UA: NEGATIVE
Ketones, UA: NEGATIVE
Leukocytes,UA: NEGATIVE
Nitrite, UA: NEGATIVE
Protein,UA: NEGATIVE
RBC, UA: NEGATIVE
Specific Gravity, UA: 1.01 (ref 1.005–1.030)
Urobilinogen, Ur: 0.2 mg/dL (ref 0.2–1.0)
pH, UA: 6.5 (ref 5.0–7.5)

## 2022-06-30 LAB — MICROALBUMIN, URINE WAIVED
Creatinine, Urine Waived: 50 mg/dL (ref 10–300)
Microalb, Ur Waived: 10 mg/L (ref 0–19)
Microalb/Creat Ratio: <30 mg/g (ref <30)

## 2022-06-30 MED ORDER — MONTELUKAST SODIUM 10 MG PO TABS: 10.0000 mg | ORAL_TABLET | Freq: Every day | ORAL | 3 refills | Status: AC

## 2022-06-30 MED ORDER — FLUTICASONE PROPIONATE 50 MCG/ACT NA SUSP: 1.0000 | Freq: Two times a day (BID) | NASAL | 3 refills | Status: AC

## 2022-06-30 MED ORDER — BISOPROLOL-HYDROCHLOROTHIAZIDE 5-6.25 MG PO TABS: 1.0000 | ORAL_TABLET | Freq: Every day | ORAL | 1 refills | Status: DC

## 2022-06-30 MED ORDER — TRAZODONE HCL 100 MG PO TABS: 50.0000 mg | ORAL_TABLET | Freq: Every evening | ORAL | 4 refills | Status: AC | PRN

## 2022-06-30 MED ORDER — ATORVASTATIN CALCIUM 20 MG PO TABS: 20.0000 mg | ORAL_TABLET | Freq: Every day | ORAL | 1 refills | Status: DC

## 2022-06-30 NOTE — Patient Instructions (Signed)
Please call to schedule your mammogram and/or bone density: [facility]  Address: [address], [place], [place] [address] Phone: [phone]  [facility] [place] [address] [place],    [address] Phone: [phone]   

## 2022-06-30 NOTE — Assessment & Plan Note (Signed)
Under good control on current regimen. Continue current regimen. Continue to monitor. Call with any concerns. Refills given.   

## 2022-06-30 NOTE — Assessment & Plan Note (Signed)
Under good control on current regimen. Continue current regimen. Continue to monitor. Call with any concerns. Refills given. Labs drawn today.   

## 2022-06-30 NOTE — Progress Notes (Signed)
BP 115/76   Pulse (!) 53   Temp 98.3 F (36.8 C)   Ht 5' 3" (1.6 m)   Wt 164 lb 14.4 oz (74.8 kg)   LMP 04/30/2015 (Exact Date)   SpO2 100%   BMI 29.21 kg/m    Subjective:    Patient ID: Kimberly Yang, female    DOB: Dec 07, 1960, 61 y.o.   MRN: [id number]  HPI: Kimberly Yang is a 61 y.o. female presenting on 06/30/2022 for comprehensive medical examination. Current medical complaints include:  HYPERTENSION / HYPERLIPIDEMIA Satisfied with current treatment? yes Duration of hypertension: chronic BP monitoring frequency: not checking BP medication side effects: no Past BP meds: bisoprolol-HCTZ Duration of hyperlipidemia: chronic Cholesterol medication side effects: no Cholesterol supplements: none Past cholesterol medications: atorvastatin Medication compliance: excellent compliance Aspirin: yes Recent stressors: no Recurrent headaches: no Visual changes: no Palpitations: no Dyspnea: no Chest pain: no Lower extremity edema: no Dizzy/lightheaded: no  Menopausal Symptoms: no  Depression Screen done today and results listed below:     06/30/2022    8:26 AM 04/11/2022   10:39 AM 12/27/2021   10:24 AM 06/18/2021    8:11 AM 05/07/2021    9:12 AM  Depression screen PHQ 2/9  Decreased Interest 0 0 0 0 0  Down, Depressed, Hopeless 0  0 0 0  PHQ - 2 Score 0 0 0 0 0  Altered sleeping 0 0 0    Tired, decreased energy 0 0 0    Change in appetite 0 0 0    Feeling bad or failure about yourself  0 0 0    Trouble concentrating 0 0 0    Moving slowly or fidgety/restless 0 0 0    Suicidal thoughts 0 0 0    PHQ-9 Score 0 0 0    Difficult doing work/chores Not difficult at all Not difficult at all       Past Medical History:  Past Medical History:  Diagnosis Date   Allergic rhinitis    Headache    Hyperlipidemia    Sinusitis    Vaginitis     Surgical History:  Past Surgical History:  Procedure Laterality Date   COLONOSCOPY     CYSTOSCOPY N/A 05/21/2015   Procedure:  CYSTOSCOPY;  Surgeon: Benjaman Kindler, MD;  Location: [facility] OR[facility];  Service: Gynecology;  Laterality: N/A;   HYSTEROSCOPY WITH D & C N/A 04/20/2015   Procedure: DILATATION AND CURETTAGE /HYSTEROSCOPY;  Surgeon: Benjaman Kindler, MD;  Location: [facility];  Service: Gynecology;  Laterality: N/A;   LAPAROSCOPIC ASSISTED VAGINAL HYSTERECTOMY N/A 05/21/2015   Procedure: LAPAROSCOPIC ASSISTED VAGINAL HYSTERECTOMY;  Surgeon: Benjaman Kindler, MD;  Location: [facility] OR[facility];  Service: Gynecology;  Laterality: N/A;   LAPAROSCOPIC SALPINGO OOPHERECTOMY Bilateral 05/21/2015   Procedure: LAPAROSCOPIC SALPINGO OOPHORECTOMY;  Surgeon: Benjaman Kindler, MD;  Location: [facility];  Service: Gynecology;  Laterality: Bilateral;   NASAL SINUS SURGERY     TOTAL ABDOMINAL HYSTERECTOMY W/ BILATERAL SALPINGOOPHORECTOMY      Medications:  Current Outpatient Medications on File Prior to Visit  Medication Sig   aspirin EC 81 MG tablet Take 81 mg by mouth daily.   cholecalciferol (VITAMIN D) 1000 UNITS tablet Take 1,000 Units by mouth daily.   No current facility-administered medications on file prior to visit.    Allergies:  No Known Allergies  Social History:  Social History   Socioeconomic History   Marital status: Divorced    Spouse name: Not on file   Number of children:  Not on file   Years of education: Not on file   Highest education level: Not on file  Occupational History   Not on file  Tobacco Use   Smoking status: Never   Smokeless tobacco: Never  Vaping Use   Vaping Use: Never used  Substance and Sexual Activity   Alcohol use: No   Drug use: No   Sexual activity: Yes  Other Topics Concern   Not on file  Social History Narrative   Not on file   Social Determinants of Health   Financial Resource Strain: Not on file  Food Insecurity: Not on file  Transportation Needs: Not on file  Physical Activity: Not on file  Stress: Not on file  Social Connections: Not on file  Intimate Partner Violence:  Not on file   Social History   Tobacco Use  Smoking Status Never  Smokeless Tobacco Never   Social History   Substance and Sexual Activity  Alcohol Use No    Family History:  Family History  Problem Relation Age of Onset   Breast cancer Neg Hx     Past medical history, surgical history, medications, allergies, family history and social history reviewed with patient today and changes made to appropriate areas of the chart.   Review of Systems  Constitutional: Negative.   HENT: Negative.    Eyes: Negative.   Respiratory: Negative.    Cardiovascular: Negative.   Gastrointestinal:  Positive for heartburn. Negative for abdominal pain, blood in stool, constipation, diarrhea, melena, nausea and vomiting.  Genitourinary: Negative.   Musculoskeletal: Negative.   Skin: Negative.   Neurological: Negative.   Endo/Heme/Allergies:  Positive for environmental allergies. Negative for polydipsia. Does not bruise/bleed easily.  Psychiatric/Behavioral: Negative.     All other ROS negative except what is listed above and in the HPI.      Objective:    BP 115/76   Pulse (!) 53   Temp 98.3 F (36.8 C)   Ht 5' 3" (1.6 m)   Wt 164 lb 14.4 oz (74.8 kg)   LMP 04/30/2015 (Exact Date)   SpO2 100%   BMI 29.21 kg/m   Wt Readings from Last 3 Encounters:  06/30/22 164 lb 14.4 oz (74.8 kg)  04/11/22 160 lb 9.6 oz (72.8 kg)  12/27/21 165 lb (74.8 kg)    Physical Exam Vitals and nursing note reviewed.  Constitutional:      General: She is not in acute distress.    Appearance: Normal appearance. She is not ill-appearing, toxic-appearing or diaphoretic.  HENT:     Head: Normocephalic and atraumatic.     Right Ear: Tympanic membrane, ear canal and external ear normal. There is no impacted cerumen.     Left Ear: Tympanic membrane, ear canal and external ear normal. There is no impacted cerumen.     Nose: Nose normal. No congestion or rhinorrhea.     Mouth/Throat:     Mouth: Mucous  membranes are moist.     Pharynx: Oropharynx is clear. No oropharyngeal exudate or posterior oropharyngeal erythema.  Eyes:     General: No scleral icterus.       Right eye: No discharge.        Left eye: No discharge.     Extraocular Movements: Extraocular movements intact.     Conjunctiva/sclera: Conjunctivae normal.     Pupils: Pupils are equal, round, and reactive to light.  Neck:     Vascular: No carotid bruit.  Cardiovascular:     Rate  and Rhythm: Normal rate and regular rhythm.     Pulses: Normal pulses.     Heart sounds: No murmur heard.    No friction rub. No gallop.  Pulmonary:     Effort: Pulmonary effort is normal. No respiratory distress.     Breath sounds: Normal breath sounds. No stridor. No wheezing, rhonchi or rales.  Chest:     Chest wall: No tenderness.  Abdominal:     General: Abdomen is flat. Bowel sounds are normal. There is no distension.     Palpations: Abdomen is soft. There is no mass.     Tenderness: There is no abdominal tenderness. There is no right CVA tenderness, left CVA tenderness, guarding or rebound.     Hernia: No hernia is present.  Genitourinary:    Comments: Breast and pelvic exams deferred with shared decision making Musculoskeletal:        General: No swelling, tenderness, deformity or signs of injury.     Cervical back: Normal range of motion and neck supple. No rigidity. No muscular tenderness.     Right lower leg: No edema.     Left lower leg: No edema.  Lymphadenopathy:     Cervical: No cervical adenopathy.  Skin:    General: Skin is warm and dry.     Capillary Refill: Capillary refill takes less than 2 seconds.     Coloration: Skin is not jaundiced or pale.     Findings: No bruising, erythema, lesion or rash.  Neurological:     General: No focal deficit present.     Mental Status: She is alert and oriented to person, place, and time. Mental status is at baseline.     Cranial Nerves: No cranial nerve deficit.     Sensory: No  sensory deficit.     Motor: No weakness.     Coordination: Coordination normal.     Gait: Gait normal.     Deep Tendon Reflexes: Reflexes normal.  Psychiatric:        Mood and Affect: Mood normal.        Behavior: Behavior normal.        Thought Content: Thought content normal.        Judgment: Judgment normal.     Results for orders placed or performed in visit on 12/27/21  Comprehensive metabolic panel  Result Value Ref Range   Glucose 94 70 - 99 mg/dL   BUN 12 8 - 27 mg/dL   Creatinine, Ser 0.84 0.57 - 1.00 mg/dL   eGFR 79 >59 mL/min/1.73   BUN/Creatinine Ratio 14 12 - 28   Sodium 139 134 - 144 mmol/L   Potassium 4.6 3.5 - 5.2 mmol/L   Chloride 100 96 - 106 mmol/L   CO2 26 20 - 29 mmol/L   Calcium 9.5 8.7 - 10.3 mg/dL   Total Protein 6.9 6.0 - 8.5 g/dL   Albumin 4.6 3.8 - 4.8 g/dL   Globulin, Total 2.3 1.5 - 4.5 g/dL   Albumin/Globulin Ratio 2.0 1.2 - 2.2   Bilirubin Total 0.3 0.0 - 1.2 mg/dL   Alkaline Phosphatase 92 44 - 121 IU/L   AST 25 0 - 40 IU/L   ALT 20 0 - 32 IU/L  Lipid Panel w/o Chol/HDL Ratio  Result Value Ref Range   Cholesterol, Total 149 100 - 199 mg/dL   Triglycerides 64 0 - 149 mg/dL   HDL 58 >39 mg/dL   VLDL Cholesterol Cal 13 5 - 40 mg/dL   LDL  Chol Calc (NIH) 78 0 - 99 mg/dL      Assessment & Plan:   Problem List Items Addressed This Visit       Cardiovascular and Mediastinum   Hypertension    Under good control on current regimen. Continue current regimen. Continue to monitor. Call with any concerns. Refills given. Labs drawn today.        Relevant Medications   atorvastatin (LIPITOR) 20 MG tablet   bisoprolol-hydrochlorothiazide (ZIAC) 5-6.25 MG tablet   Other Relevant Orders   Comprehensive metabolic panel   Microalbumin, Urine Waived     Other   Hyperlipidemia    Under good control on current regimen. Continue current regimen. Continue to monitor. Call with any concerns. Refills given. Labs drawn today.        Relevant  Medications   atorvastatin (LIPITOR) 20 MG tablet   bisoprolol-hydrochlorothiazide (ZIAC) 5-6.25 MG tablet   Other Relevant Orders   Comprehensive metabolic panel   Lipid Panel w/o Chol/HDL Ratio   Insomnia    Under good control on current regimen. Continue current regimen. Continue to monitor. Call with any concerns. Refills given.        Relevant Medications   traZODone (DESYREL) 100 MG tablet   Other Visit Diagnoses     Routine general medical examination at a health care facility    -  Primary   Vaccines up to date. Screening labs checked today. Pap N/A. Mammo ordered. Holding on colonoscopy for now. Continue diet and exercise. Call with any concerns.    Relevant Orders   CBC with Differential/Platelet   Comprehensive metabolic panel   Lipid Panel w/o Chol/HDL Ratio   Urinalysis, Routine w reflex microscopic   TSH   Microalbumin, Urine Waived   HIV Antibody (routine testing w rflx)   Encounter for screening mammogram for malignant neoplasm of breast       Mammo ordered.    Relevant Orders   MM 3D SCREEN BREAST BILATERAL   Essential hypertension       Relevant Medications   atorvastatin (LIPITOR) 20 MG tablet   bisoprolol-hydrochlorothiazide (ZIAC) 5-6.25 MG tablet        Follow up plan: Return in about 6 months (around 12/31/2022).   LABORATORY TESTING:  - Pap smear: not applicable  IMMUNIZATIONS:   - Tdap: Tetanus vaccination status reviewed: last tetanus booster within 10 years. - Influenza: Postponed to flu season - Pneumovax: Not applicable - Prevnar: Not applicable - COVID: Up to date - HPV: Not applicable - Shingrix vaccine: Up to date  SCREENING: -Mammogram: Ordered today  - Colonoscopy: would like to wait until the new year   PATIENT COUNSELING:   Advised to take 1 mg of folate supplement per day if capable of pregnancy.   Sexuality: Discussed sexually transmitted diseases, partner selection, use of condoms, avoidance of unintended pregnancy   and contraceptive alternatives.   Advised to avoid cigarette smoking.  I discussed with the patient that most people either abstain from alcohol or drink within safe limits (<=14/week and <=4 drinks/occasion for males, <=7/weeks and <= 3 drinks/occasion for females) and that the risk for alcohol disorders and other health effects rises proportionally with the number of drinks per week and how often a drinker exceeds daily limits.  Discussed cessation/primary prevention of drug use and availability of treatment for abuse.   Diet: Encouraged to adjust caloric intake to maintain  or achieve ideal body weight, to reduce intake of dietary saturated fat and total fat, to limit  sodium intake by avoiding high sodium foods and not adding table salt, and to maintain adequate dietary potassium and calcium preferably from fresh fruits, vegetables, and low-fat dairy products.    stressed the importance of regular exercise  Injury prevention: Discussed safety belts, safety helmets, smoke detector, smoking near bedding or upholstery.   Dental health: Discussed importance of regular tooth brushing, flossing, and dental visits.    NEXT PREVENTATIVE PHYSICAL DUE IN 1 YEAR. Return in about 6 months (around 12/31/2022).

## 2022-07-01 LAB — CBC WITH DIFFERENTIAL/PLATELET
Basophils Absolute: 0 10*3/uL (ref 0.0–0.2)
Basos: 1 %
EOS (ABSOLUTE): 0.1 10*3/uL (ref 0.0–0.4)
Eos: 3 %
Hematocrit: 42.7 % (ref 34.0–46.6)
Hemoglobin: 13.7 g/dL (ref 11.1–15.9)
Immature Grans (Abs): 0 10*3/uL (ref 0.0–0.1)
Immature Granulocytes: 0 %
Lymphocytes Absolute: 1.6 10*3/uL (ref 0.7–3.1)
Lymphs: 36 %
MCH: 30.4 pg (ref 26.6–33.0)
MCHC: 32.1 g/dL (ref 31.5–35.7)
MCV: 95 fL (ref 79–97)
Monocytes Absolute: 0.4 10*3/uL (ref 0.1–0.9)
Monocytes: 9 %
Neutrophils Absolute: 2.3 10*3/uL (ref 1.4–7.0)
Neutrophils: 51 %
Platelets: 235 10*3/uL (ref 150–450)
RBC: 4.5 x10E6/uL (ref 3.77–5.28)
RDW: 11.9 % (ref 11.7–15.4)
WBC: 4.5 10*3/uL (ref 3.4–10.8)

## 2022-07-01 LAB — HIV ANTIBODY (ROUTINE TESTING W REFLEX): HIV Screen 4th Generation wRfx: NONREACTIVE

## 2022-07-01 LAB — COMPREHENSIVE METABOLIC PANEL
ALT: 18 IU/L (ref 0–32)
AST: 23 IU/L (ref 0–40)
Albumin/Globulin Ratio: 2.3 — ABNORMAL HIGH (ref 1.2–2.2)
Albumin: 4.5 g/dL (ref 3.9–4.9)
Alkaline Phosphatase: 84 IU/L (ref 44–121)
BUN/Creatinine Ratio: 10 — ABNORMAL LOW (ref 12–28)
BUN: 8 mg/dL (ref 8–27)
Bilirubin Total: 0.2 mg/dL (ref 0.0–1.2)
CO2: 25 mmol/L (ref 20–29)
Calcium: 9 mg/dL (ref 8.7–10.3)
Chloride: 100 mmol/L (ref 96–106)
Creatinine, Ser: 0.83 mg/dL (ref 0.57–1.00)
Globulin, Total: 2 g/dL (ref 1.5–4.5)
Glucose: 90 mg/dL (ref 70–99)
Potassium: 4.1 mmol/L (ref 3.5–5.2)
Sodium: 138 mmol/L (ref 134–144)
Total Protein: 6.5 g/dL (ref 6.0–8.5)
eGFR: 79 mL/min/{1.73_m2} (ref >59)

## 2022-07-01 LAB — LIPID PANEL W/O CHOL/HDL RATIO
Cholesterol, Total: 135 mg/dL (ref 100–199)
HDL: 50 mg/dL (ref >39)
LDL Chol Calc (NIH): 59 mg/dL (ref 0–99)
Triglycerides: 155 mg/dL — ABNORMAL HIGH (ref 0–149)
VLDL Cholesterol Cal: 26 mg/dL (ref 5–40)

## 2022-07-01 LAB — TSH: TSH: 1.84 u[IU]/mL (ref 0.450–4.500)

## 2022-12-31 ENCOUNTER — Ambulatory Visit: Payer: BC Managed Care – PPO | Admitting: Family Medicine

## 2023-08-01 IMAGING — MG MM DIGITAL SCREENING BILAT W/ TOMO AND CAD
8 series · 8 of 24 positions shown · non-contrast
Comparison: Previous exam(s).

CLINICAL DATA: Screening.

EXAM:
DIGITAL SCREENING BILATERAL MAMMOGRAM WITH TOMOSYNTHESIS AND CAD
TECHNIQUE: Bilateral screening digital craniocaudal and mediolateral oblique
mammograms were obtained. Bilateral screening digital breast
tomosynthesis was performed. The images were evaluated with
computer-aided detection.

[R CC synth-2D]
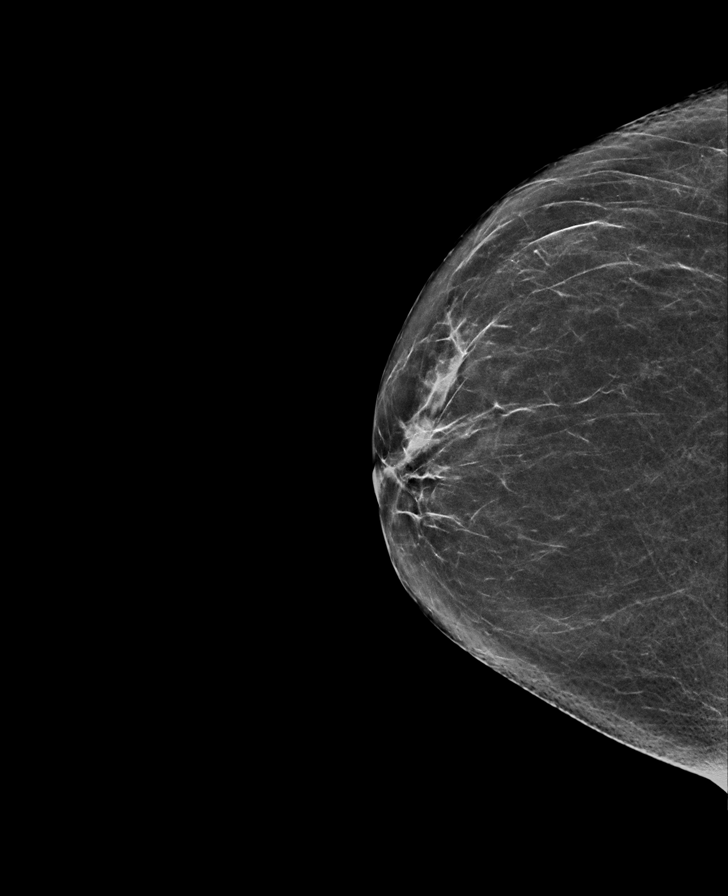

[L MLO synth-2D]
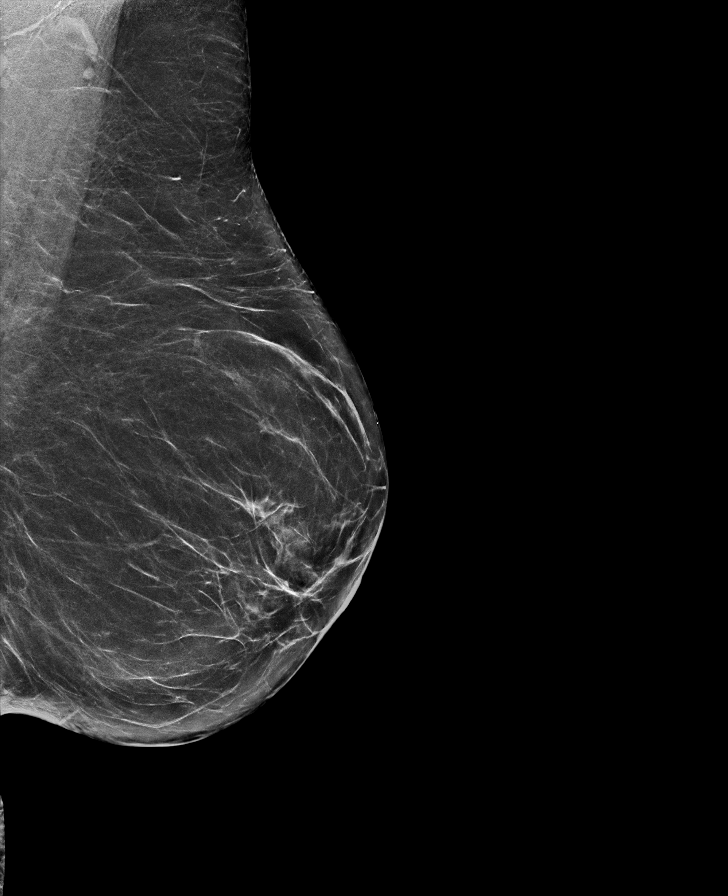

[L CC synth-2D]
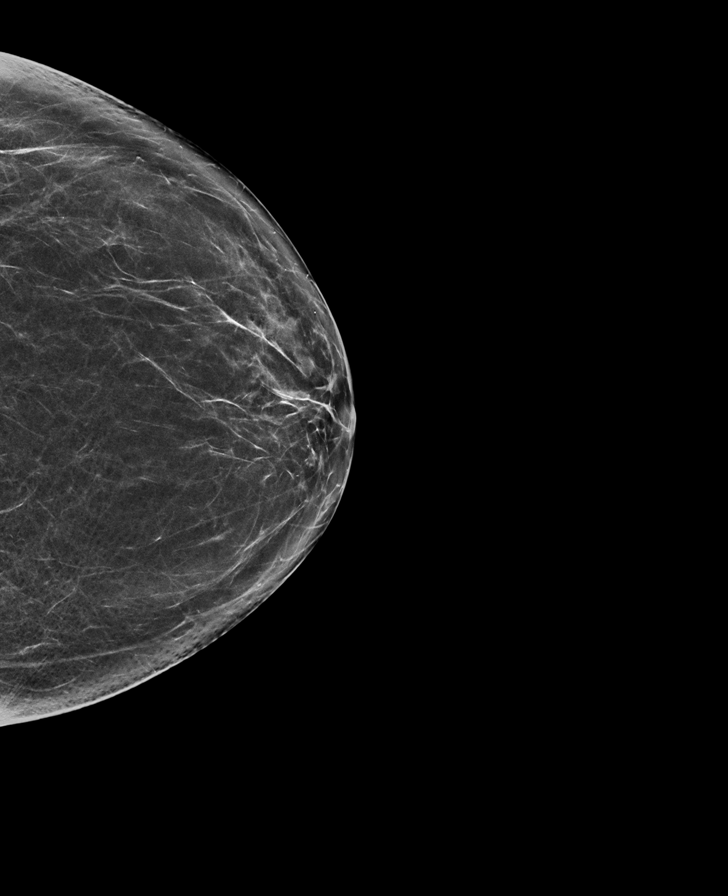

[R MLO synth-2D]
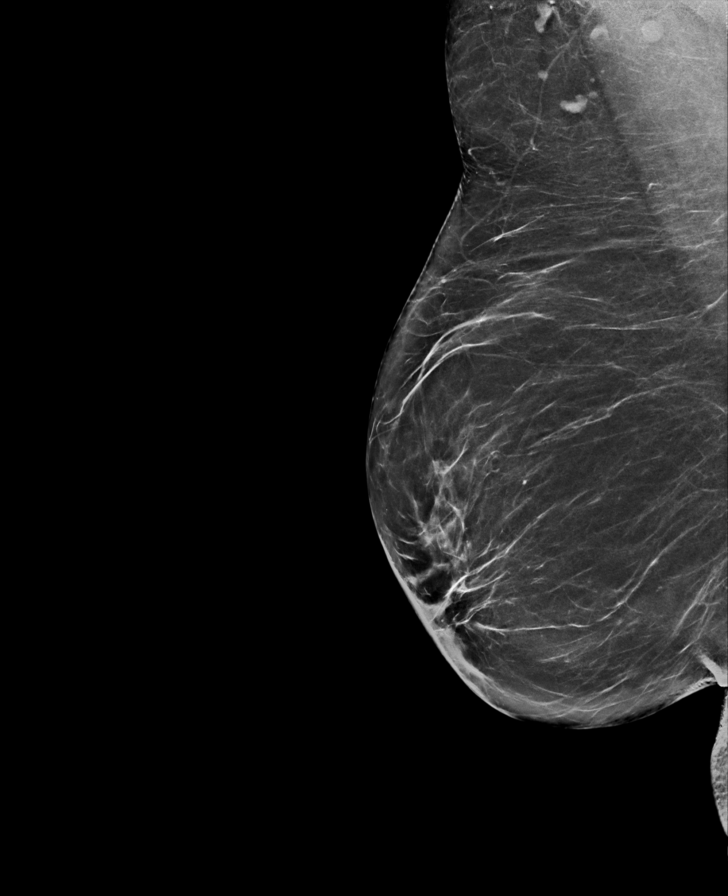

[L MLO tomo · tomo slice 36/71.0]
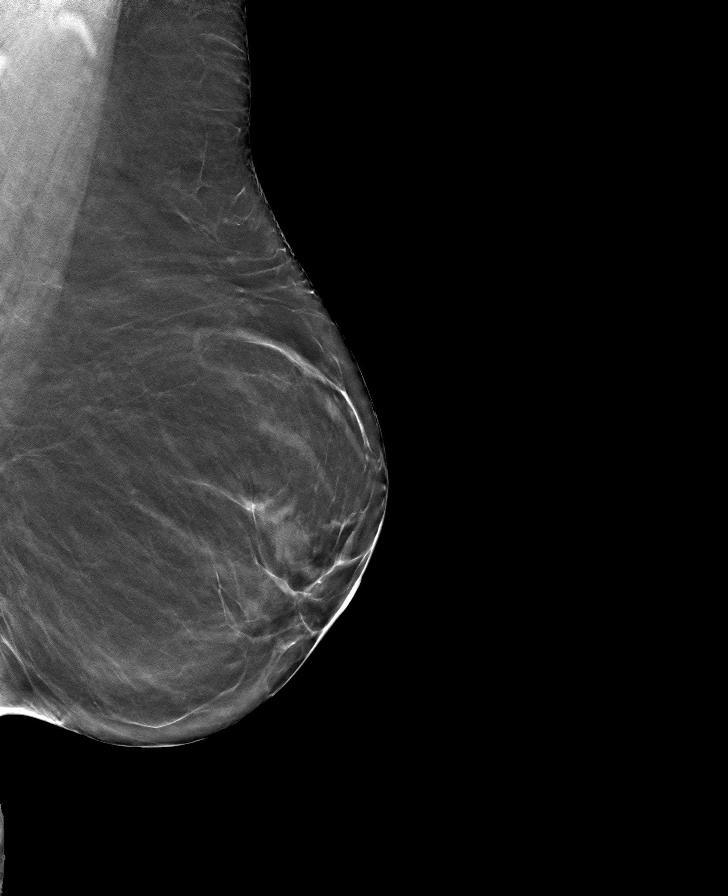

[R CC tomo · tomo slice 33/64.0]
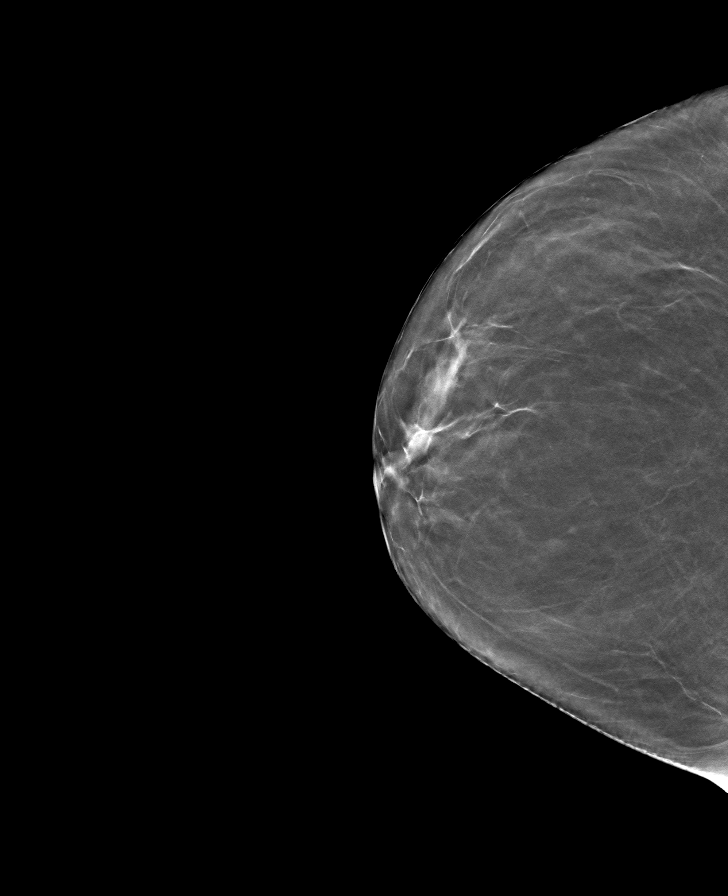

[L CC tomo · tomo slice 35/68.0]
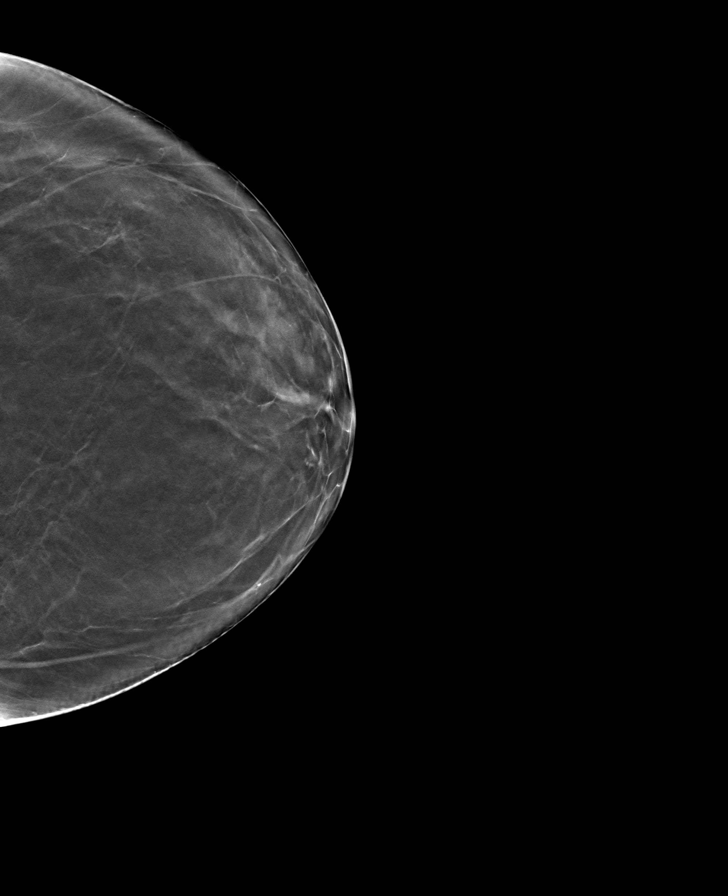

[R MLO tomo · tomo slice 37/73.0]
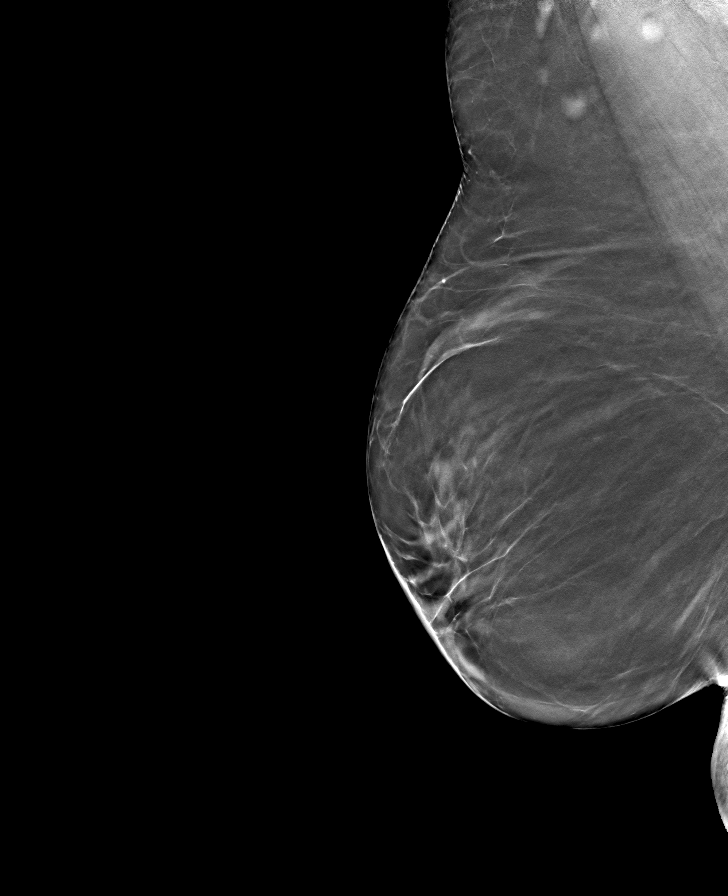

[8 of 24 positions shown; findings below may reference images not displayed]

ACR Breast Density Category b: There are scattered areas of
fibroglandular density.
FINDINGS: There are no findings suspicious for malignancy.
IMPRESSION: No mammographic evidence of malignancy. A result letter of this
screening mammogram will be mailed directly to the patient.

RECOMMENDATION:
Screening mammogram in one year. (Code:51-O-LD2)

BI-RADS CATEGORY  1: Negative.

## 2024-12-22 ENCOUNTER — Other Ambulatory Visit: Payer: Self-pay | Admitting: Family Medicine

## 2024-12-22 DIAGNOSIS — E785 Hyperlipidemia, unspecified: Secondary | ICD-10-CM

## 2024-12-22 DIAGNOSIS — I10 Essential (primary) hypertension: Secondary | ICD-10-CM
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
## 2024-12-26 ENCOUNTER — Ambulatory Visit: Admitting: Family Medicine

## 2025-01-03 ENCOUNTER — Encounter: Payer: Self-pay | Admitting: Family Medicine

## 2025-01-03 DIAGNOSIS — I10 Essential (primary) hypertension: Secondary | ICD-10-CM

## 2025-01-03 DIAGNOSIS — E785 Hyperlipidemia, unspecified: Secondary | ICD-10-CM

## 2025-01-03 MED ORDER — ATORVASTATIN CALCIUM 20 MG PO TABS: 20.0000 mg | ORAL_TABLET | Freq: Every day | ORAL | 0 refills | Status: AC

## 2025-01-03 MED ORDER — BISOPROLOL-HYDROCHLOROTHIAZIDE 5-6.25 MG PO TABS: 1.0000 | ORAL_TABLET | Freq: Every day | ORAL | 0 refills | Status: AC

## 2025-02-06 ENCOUNTER — Ambulatory Visit: Admitting: Family Medicine
